# Patient Record
Sex: Male | Born: 1940 | Race: Black or African American | Hispanic: No | Marital: Married | Smoking: Never smoker
Health system: Southern US, Community
[De-identification: ages and names within clinical notes are randomized; demographics above are authoritative.]

## PROBLEM LIST (undated history)

## (undated) ENCOUNTER — Emergency Department (HOSPITAL_COMMUNITY): Admission: EM | Payer: Medicare Other | Source: Home / Self Care

## (undated) DIAGNOSIS — I10 Essential (primary) hypertension: Secondary | ICD-10-CM

## (undated) DIAGNOSIS — N289 Disorder of kidney and ureter, unspecified: Secondary | ICD-10-CM

## (undated) HISTORY — PX: NO PAST SURGERIES: SHX2092

---

## 1999-06-17 ENCOUNTER — Encounter: Admission: RE | Admit: 1999-06-17 | Discharge: 1999-06-17 | Payer: Self-pay | Admitting: Gastroenterology

## 1999-06-17 ENCOUNTER — Encounter: Payer: Self-pay | Admitting: Gastroenterology

## 1999-07-21 ENCOUNTER — Ambulatory Visit (HOSPITAL_COMMUNITY): Admission: RE | Admit: 1999-07-21 | Discharge: 1999-07-21 | Payer: Self-pay | Admitting: Gastroenterology

## 2006-06-22 ENCOUNTER — Encounter: Admission: RE | Admit: 2006-06-22 | Discharge: 2006-06-22 | Payer: Self-pay | Admitting: Surgery

## 2006-09-01 ENCOUNTER — Ambulatory Visit (HOSPITAL_COMMUNITY): Admission: RE | Admit: 2006-09-01 | Discharge: 2006-09-02 | Payer: Self-pay | Admitting: Surgery

## 2011-08-30 DIAGNOSIS — B079 Viral wart, unspecified: Secondary | ICD-10-CM | POA: Diagnosis not present

## 2011-09-21 DIAGNOSIS — N401 Enlarged prostate with lower urinary tract symptoms: Secondary | ICD-10-CM | POA: Diagnosis not present

## 2011-09-21 DIAGNOSIS — R972 Elevated prostate specific antigen [PSA]: Secondary | ICD-10-CM | POA: Diagnosis not present

## 2011-10-04 DIAGNOSIS — B079 Viral wart, unspecified: Secondary | ICD-10-CM | POA: Diagnosis not present

## 2011-10-13 DIAGNOSIS — H2510 Age-related nuclear cataract, unspecified eye: Secondary | ICD-10-CM | POA: Diagnosis not present

## 2011-11-01 DIAGNOSIS — B079 Viral wart, unspecified: Secondary | ICD-10-CM | POA: Diagnosis not present

## 2011-11-28 DIAGNOSIS — Z23 Encounter for immunization: Secondary | ICD-10-CM | POA: Diagnosis not present

## 2011-11-28 DIAGNOSIS — Z0000 Encounter for general adult medical examination without abnormal findings: Secondary | ICD-10-CM | POA: Diagnosis not present

## 2011-11-28 DIAGNOSIS — I10 Essential (primary) hypertension: Secondary | ICD-10-CM | POA: Diagnosis not present

## 2011-11-28 DIAGNOSIS — E7800 Pure hypercholesterolemia, unspecified: Secondary | ICD-10-CM | POA: Diagnosis not present

## 2011-11-28 DIAGNOSIS — Z79899 Other long term (current) drug therapy: Secondary | ICD-10-CM | POA: Diagnosis not present

## 2011-12-06 DIAGNOSIS — B353 Tinea pedis: Secondary | ICD-10-CM | POA: Diagnosis not present

## 2011-12-06 DIAGNOSIS — B079 Viral wart, unspecified: Secondary | ICD-10-CM | POA: Diagnosis not present

## 2011-12-14 DIAGNOSIS — R972 Elevated prostate specific antigen [PSA]: Secondary | ICD-10-CM | POA: Diagnosis not present

## 2011-12-21 DIAGNOSIS — N401 Enlarged prostate with lower urinary tract symptoms: Secondary | ICD-10-CM | POA: Diagnosis not present

## 2011-12-21 DIAGNOSIS — R972 Elevated prostate specific antigen [PSA]: Secondary | ICD-10-CM | POA: Diagnosis not present

## 2012-01-10 DIAGNOSIS — B079 Viral wart, unspecified: Secondary | ICD-10-CM | POA: Diagnosis not present

## 2012-03-06 DIAGNOSIS — B079 Viral wart, unspecified: Secondary | ICD-10-CM | POA: Diagnosis not present

## 2012-03-26 DIAGNOSIS — R972 Elevated prostate specific antigen [PSA]: Secondary | ICD-10-CM | POA: Diagnosis not present

## 2012-04-02 DIAGNOSIS — N401 Enlarged prostate with lower urinary tract symptoms: Secondary | ICD-10-CM | POA: Diagnosis not present

## 2012-04-02 DIAGNOSIS — R972 Elevated prostate specific antigen [PSA]: Secondary | ICD-10-CM | POA: Diagnosis not present

## 2012-04-24 DIAGNOSIS — B079 Viral wart, unspecified: Secondary | ICD-10-CM | POA: Diagnosis not present

## 2012-05-22 DIAGNOSIS — B079 Viral wart, unspecified: Secondary | ICD-10-CM | POA: Diagnosis not present

## 2012-06-26 DIAGNOSIS — B079 Viral wart, unspecified: Secondary | ICD-10-CM | POA: Diagnosis not present

## 2012-06-26 DIAGNOSIS — B353 Tinea pedis: Secondary | ICD-10-CM | POA: Diagnosis not present

## 2012-07-24 DIAGNOSIS — B079 Viral wart, unspecified: Secondary | ICD-10-CM | POA: Diagnosis not present

## 2012-09-26 DIAGNOSIS — R972 Elevated prostate specific antigen [PSA]: Secondary | ICD-10-CM | POA: Diagnosis not present

## 2012-10-18 DIAGNOSIS — H524 Presbyopia: Secondary | ICD-10-CM | POA: Diagnosis not present

## 2012-10-18 DIAGNOSIS — H2510 Age-related nuclear cataract, unspecified eye: Secondary | ICD-10-CM | POA: Diagnosis not present

## 2012-10-18 DIAGNOSIS — H5210 Myopia, unspecified eye: Secondary | ICD-10-CM | POA: Diagnosis not present

## 2012-11-29 DIAGNOSIS — Z79899 Other long term (current) drug therapy: Secondary | ICD-10-CM | POA: Diagnosis not present

## 2012-11-29 DIAGNOSIS — I10 Essential (primary) hypertension: Secondary | ICD-10-CM | POA: Diagnosis not present

## 2012-11-29 DIAGNOSIS — Z0000 Encounter for general adult medical examination without abnormal findings: Secondary | ICD-10-CM | POA: Diagnosis not present

## 2012-11-29 DIAGNOSIS — D509 Iron deficiency anemia, unspecified: Secondary | ICD-10-CM | POA: Diagnosis not present

## 2012-11-29 DIAGNOSIS — E7800 Pure hypercholesterolemia, unspecified: Secondary | ICD-10-CM | POA: Diagnosis not present

## 2012-11-29 DIAGNOSIS — K219 Gastro-esophageal reflux disease without esophagitis: Secondary | ICD-10-CM | POA: Diagnosis not present

## 2013-03-27 DIAGNOSIS — R972 Elevated prostate specific antigen [PSA]: Secondary | ICD-10-CM | POA: Diagnosis not present

## 2013-04-03 ENCOUNTER — Other Ambulatory Visit (HOSPITAL_COMMUNITY): Payer: Self-pay | Admitting: Urology

## 2013-04-03 DIAGNOSIS — N401 Enlarged prostate with lower urinary tract symptoms: Secondary | ICD-10-CM | POA: Diagnosis not present

## 2013-04-03 DIAGNOSIS — R972 Elevated prostate specific antigen [PSA]: Secondary | ICD-10-CM

## 2013-04-11 ENCOUNTER — Ambulatory Visit (HOSPITAL_COMMUNITY)
Admission: RE | Admit: 2013-04-11 | Discharge: 2013-04-11 | Disposition: A | Discharge status: Home / Self Care | Payer: Medicare Other | Source: Ambulatory Visit | Attending: Urology | Admitting: Urology

## 2013-04-11 DIAGNOSIS — N423 Unspecified dysplasia of prostate: Secondary | ICD-10-CM | POA: Insufficient documentation

## 2013-04-11 DIAGNOSIS — N400 Enlarged prostate without lower urinary tract symptoms: Secondary | ICD-10-CM | POA: Diagnosis not present

## 2013-04-11 DIAGNOSIS — R972 Elevated prostate specific antigen [PSA]: Secondary | ICD-10-CM | POA: Diagnosis not present

## 2013-04-11 LAB — CREATININE, SERUM
Creatinine, Ser: 1.5 mg/dL — ABNORMAL HIGH (ref 0.50–1.35)
GFR calc Af Amer: 53 mL/min — ABNORMAL LOW
GFR calc non Af Amer: 46 mL/min — ABNORMAL LOW

## 2013-04-11 MED ORDER — GADOBENATE DIMEGLUMINE 529 MG/ML IV SOLN
20.0000 mL | Freq: Once | INTRAVENOUS | Status: AC | PRN
  Administered 2013-04-11: 16 mL via INTRAVENOUS

## 2013-09-30 DIAGNOSIS — R972 Elevated prostate specific antigen [PSA]: Secondary | ICD-10-CM | POA: Diagnosis not present

## 2013-10-07 DIAGNOSIS — N139 Obstructive and reflux uropathy, unspecified: Secondary | ICD-10-CM | POA: Diagnosis not present

## 2013-10-07 DIAGNOSIS — R972 Elevated prostate specific antigen [PSA]: Secondary | ICD-10-CM | POA: Diagnosis not present

## 2013-10-07 DIAGNOSIS — N401 Enlarged prostate with lower urinary tract symptoms: Secondary | ICD-10-CM | POA: Diagnosis not present

## 2013-10-24 DIAGNOSIS — H524 Presbyopia: Secondary | ICD-10-CM | POA: Diagnosis not present

## 2013-10-24 DIAGNOSIS — H5210 Myopia, unspecified eye: Secondary | ICD-10-CM | POA: Diagnosis not present

## 2013-10-24 DIAGNOSIS — H2510 Age-related nuclear cataract, unspecified eye: Secondary | ICD-10-CM | POA: Diagnosis not present

## 2013-12-03 DIAGNOSIS — Z0000 Encounter for general adult medical examination without abnormal findings: Secondary | ICD-10-CM | POA: Diagnosis not present

## 2013-12-03 DIAGNOSIS — K219 Gastro-esophageal reflux disease without esophagitis: Secondary | ICD-10-CM | POA: Diagnosis not present

## 2013-12-03 DIAGNOSIS — I10 Essential (primary) hypertension: Secondary | ICD-10-CM | POA: Diagnosis not present

## 2013-12-03 DIAGNOSIS — Z23 Encounter for immunization: Secondary | ICD-10-CM | POA: Diagnosis not present

## 2013-12-03 DIAGNOSIS — E7800 Pure hypercholesterolemia, unspecified: Secondary | ICD-10-CM | POA: Diagnosis not present

## 2013-12-09 DIAGNOSIS — IMO0002 Reserved for concepts with insufficient information to code with codable children: Secondary | ICD-10-CM | POA: Diagnosis not present

## 2013-12-09 DIAGNOSIS — R972 Elevated prostate specific antigen [PSA]: Secondary | ICD-10-CM | POA: Diagnosis not present

## 2013-12-16 DIAGNOSIS — R5381 Other malaise: Secondary | ICD-10-CM | POA: Diagnosis not present

## 2013-12-16 DIAGNOSIS — E785 Hyperlipidemia, unspecified: Secondary | ICD-10-CM | POA: Diagnosis not present

## 2013-12-16 DIAGNOSIS — D6489 Other specified anemias: Secondary | ICD-10-CM | POA: Diagnosis not present

## 2013-12-16 DIAGNOSIS — R5383 Other fatigue: Secondary | ICD-10-CM | POA: Diagnosis not present

## 2013-12-16 DIAGNOSIS — I10 Essential (primary) hypertension: Secondary | ICD-10-CM | POA: Diagnosis not present

## 2013-12-16 DIAGNOSIS — K219 Gastro-esophageal reflux disease without esophagitis: Secondary | ICD-10-CM | POA: Diagnosis not present

## 2013-12-25 DIAGNOSIS — R0609 Other forms of dyspnea: Secondary | ICD-10-CM | POA: Diagnosis not present

## 2013-12-25 DIAGNOSIS — R0989 Other specified symptoms and signs involving the circulatory and respiratory systems: Secondary | ICD-10-CM | POA: Diagnosis not present

## 2014-02-28 DIAGNOSIS — I10 Essential (primary) hypertension: Secondary | ICD-10-CM | POA: Diagnosis not present

## 2014-04-09 DIAGNOSIS — I129 Hypertensive chronic kidney disease with stage 1 through stage 4 chronic kidney disease, or unspecified chronic kidney disease: Secondary | ICD-10-CM | POA: Diagnosis not present

## 2014-04-09 DIAGNOSIS — D539 Nutritional anemia, unspecified: Secondary | ICD-10-CM | POA: Diagnosis not present

## 2014-04-09 DIAGNOSIS — N183 Chronic kidney disease, stage 3 unspecified: Secondary | ICD-10-CM | POA: Diagnosis not present

## 2014-04-14 DIAGNOSIS — N183 Chronic kidney disease, stage 3 unspecified: Secondary | ICD-10-CM | POA: Diagnosis not present

## 2014-04-17 DIAGNOSIS — Q619 Cystic kidney disease, unspecified: Secondary | ICD-10-CM | POA: Diagnosis not present

## 2014-05-19 DIAGNOSIS — D539 Nutritional anemia, unspecified: Secondary | ICD-10-CM | POA: Diagnosis not present

## 2014-05-19 DIAGNOSIS — N183 Chronic kidney disease, stage 3 unspecified: Secondary | ICD-10-CM | POA: Diagnosis not present

## 2014-05-21 DIAGNOSIS — Q619 Cystic kidney disease, unspecified: Secondary | ICD-10-CM | POA: Diagnosis not present

## 2014-05-21 DIAGNOSIS — N183 Chronic kidney disease, stage 3 unspecified: Secondary | ICD-10-CM | POA: Diagnosis not present

## 2014-05-21 DIAGNOSIS — I129 Hypertensive chronic kidney disease with stage 1 through stage 4 chronic kidney disease, or unspecified chronic kidney disease: Secondary | ICD-10-CM | POA: Diagnosis not present

## 2014-06-18 DIAGNOSIS — R972 Elevated prostate specific antigen [PSA]: Secondary | ICD-10-CM | POA: Diagnosis not present

## 2014-06-18 DIAGNOSIS — N401 Enlarged prostate with lower urinary tract symptoms: Secondary | ICD-10-CM | POA: Diagnosis not present

## 2014-06-18 DIAGNOSIS — R3914 Feeling of incomplete bladder emptying: Secondary | ICD-10-CM | POA: Diagnosis not present

## 2014-06-18 DIAGNOSIS — R350 Frequency of micturition: Secondary | ICD-10-CM | POA: Diagnosis not present

## 2014-09-22 DIAGNOSIS — N183 Chronic kidney disease, stage 3 (moderate): Secondary | ICD-10-CM | POA: Diagnosis not present

## 2014-09-24 DIAGNOSIS — N183 Chronic kidney disease, stage 3 (moderate): Secondary | ICD-10-CM | POA: Diagnosis not present

## 2014-09-24 DIAGNOSIS — Q6102 Congenital multiple renal cysts: Secondary | ICD-10-CM | POA: Diagnosis not present

## 2014-09-24 DIAGNOSIS — I129 Hypertensive chronic kidney disease with stage 1 through stage 4 chronic kidney disease, or unspecified chronic kidney disease: Secondary | ICD-10-CM | POA: Diagnosis not present

## 2014-10-30 DIAGNOSIS — H524 Presbyopia: Secondary | ICD-10-CM | POA: Diagnosis not present

## 2014-10-30 DIAGNOSIS — H2513 Age-related nuclear cataract, bilateral: Secondary | ICD-10-CM | POA: Diagnosis not present

## 2014-12-22 DIAGNOSIS — Z0000 Encounter for general adult medical examination without abnormal findings: Secondary | ICD-10-CM | POA: Diagnosis not present

## 2014-12-22 DIAGNOSIS — I10 Essential (primary) hypertension: Secondary | ICD-10-CM | POA: Diagnosis not present

## 2014-12-22 DIAGNOSIS — Z79899 Other long term (current) drug therapy: Secondary | ICD-10-CM | POA: Diagnosis not present

## 2014-12-22 DIAGNOSIS — K219 Gastro-esophageal reflux disease without esophagitis: Secondary | ICD-10-CM | POA: Diagnosis not present

## 2014-12-22 DIAGNOSIS — N183 Chronic kidney disease, stage 3 (moderate): Secondary | ICD-10-CM | POA: Diagnosis not present

## 2014-12-22 DIAGNOSIS — D649 Anemia, unspecified: Secondary | ICD-10-CM | POA: Diagnosis not present

## 2014-12-22 DIAGNOSIS — E780 Pure hypercholesterolemia: Secondary | ICD-10-CM | POA: Diagnosis not present

## 2014-12-23 DIAGNOSIS — R350 Frequency of micturition: Secondary | ICD-10-CM | POA: Diagnosis not present

## 2014-12-23 DIAGNOSIS — R351 Nocturia: Secondary | ICD-10-CM | POA: Diagnosis not present

## 2015-01-12 DIAGNOSIS — N183 Chronic kidney disease, stage 3 (moderate): Secondary | ICD-10-CM | POA: Diagnosis not present

## 2015-01-16 DIAGNOSIS — I129 Hypertensive chronic kidney disease with stage 1 through stage 4 chronic kidney disease, or unspecified chronic kidney disease: Secondary | ICD-10-CM | POA: Diagnosis not present

## 2015-01-16 DIAGNOSIS — N183 Chronic kidney disease, stage 3 (moderate): Secondary | ICD-10-CM | POA: Diagnosis not present

## 2015-01-16 DIAGNOSIS — N281 Cyst of kidney, acquired: Secondary | ICD-10-CM | POA: Diagnosis not present

## 2015-01-20 DIAGNOSIS — R972 Elevated prostate specific antigen [PSA]: Secondary | ICD-10-CM | POA: Diagnosis not present

## 2015-01-26 DIAGNOSIS — N401 Enlarged prostate with lower urinary tract symptoms: Secondary | ICD-10-CM | POA: Diagnosis not present

## 2015-01-26 DIAGNOSIS — R351 Nocturia: Secondary | ICD-10-CM | POA: Diagnosis not present

## 2015-01-26 DIAGNOSIS — N3281 Overactive bladder: Secondary | ICD-10-CM | POA: Diagnosis not present

## 2015-03-05 DIAGNOSIS — R972 Elevated prostate specific antigen [PSA]: Secondary | ICD-10-CM | POA: Diagnosis not present

## 2015-03-05 DIAGNOSIS — R351 Nocturia: Secondary | ICD-10-CM | POA: Diagnosis not present

## 2015-03-05 DIAGNOSIS — R3914 Feeling of incomplete bladder emptying: Secondary | ICD-10-CM | POA: Diagnosis not present

## 2015-03-05 DIAGNOSIS — N401 Enlarged prostate with lower urinary tract symptoms: Secondary | ICD-10-CM | POA: Diagnosis not present

## 2015-05-12 DIAGNOSIS — N183 Chronic kidney disease, stage 3 (moderate): Secondary | ICD-10-CM | POA: Diagnosis not present

## 2015-05-19 DIAGNOSIS — E559 Vitamin D deficiency, unspecified: Secondary | ICD-10-CM | POA: Diagnosis not present

## 2015-05-19 DIAGNOSIS — N183 Chronic kidney disease, stage 3 (moderate): Secondary | ICD-10-CM | POA: Diagnosis not present

## 2015-05-19 DIAGNOSIS — I129 Hypertensive chronic kidney disease with stage 1 through stage 4 chronic kidney disease, or unspecified chronic kidney disease: Secondary | ICD-10-CM | POA: Diagnosis not present

## 2015-05-19 DIAGNOSIS — N281 Cyst of kidney, acquired: Secondary | ICD-10-CM | POA: Diagnosis not present

## 2015-09-02 DIAGNOSIS — R972 Elevated prostate specific antigen [PSA]: Secondary | ICD-10-CM | POA: Diagnosis not present

## 2015-09-09 DIAGNOSIS — R972 Elevated prostate specific antigen [PSA]: Secondary | ICD-10-CM | POA: Diagnosis not present

## 2015-09-09 DIAGNOSIS — N401 Enlarged prostate with lower urinary tract symptoms: Secondary | ICD-10-CM | POA: Diagnosis not present

## 2015-09-09 DIAGNOSIS — Z0000 Encounter for general adult medical examination without abnormal findings: Secondary | ICD-10-CM | POA: Diagnosis not present

## 2015-09-09 DIAGNOSIS — R351 Nocturia: Secondary | ICD-10-CM | POA: Diagnosis not present

## 2015-09-10 ENCOUNTER — Other Ambulatory Visit: Payer: Self-pay | Admitting: Urology

## 2015-09-10 DIAGNOSIS — R972 Elevated prostate specific antigen [PSA]: Secondary | ICD-10-CM

## 2015-09-15 DIAGNOSIS — N183 Chronic kidney disease, stage 3 (moderate): Secondary | ICD-10-CM | POA: Diagnosis not present

## 2015-09-15 DIAGNOSIS — Z1159 Encounter for screening for other viral diseases: Secondary | ICD-10-CM | POA: Diagnosis not present

## 2015-09-17 ENCOUNTER — Ambulatory Visit (HOSPITAL_COMMUNITY)
Admission: RE | Admit: 2015-09-17 | Discharge: 2015-09-17 | Disposition: A | Discharge status: Home / Self Care | Payer: Medicare Other | Source: Ambulatory Visit | Attending: Urology | Admitting: Urology

## 2015-09-17 DIAGNOSIS — N400 Enlarged prostate without lower urinary tract symptoms: Secondary | ICD-10-CM | POA: Insufficient documentation

## 2015-09-17 DIAGNOSIS — R972 Elevated prostate specific antigen [PSA]: Secondary | ICD-10-CM | POA: Insufficient documentation

## 2015-09-17 LAB — POCT I-STAT CREATININE: CREATININE: 1.9 mg/dL — AB (ref 0.61–1.24)

## 2015-09-17 MED ORDER — GADOBENATE DIMEGLUMINE 529 MG/ML IV SOLN
10.0000 mL | Freq: Once | INTRAVENOUS | Status: AC | PRN
  Administered 2015-09-17: 8 mL via INTRAVENOUS

## 2015-09-21 DIAGNOSIS — N183 Chronic kidney disease, stage 3 (moderate): Secondary | ICD-10-CM | POA: Diagnosis not present

## 2015-09-21 DIAGNOSIS — I129 Hypertensive chronic kidney disease with stage 1 through stage 4 chronic kidney disease, or unspecified chronic kidney disease: Secondary | ICD-10-CM | POA: Diagnosis not present

## 2015-12-16 DIAGNOSIS — R972 Elevated prostate specific antigen [PSA]: Secondary | ICD-10-CM | POA: Diagnosis not present

## 2015-12-16 DIAGNOSIS — Z0000 Encounter for general adult medical examination without abnormal findings: Secondary | ICD-10-CM | POA: Diagnosis not present

## 2015-12-16 DIAGNOSIS — N401 Enlarged prostate with lower urinary tract symptoms: Secondary | ICD-10-CM | POA: Diagnosis not present

## 2015-12-16 DIAGNOSIS — R350 Frequency of micturition: Secondary | ICD-10-CM | POA: Diagnosis not present

## 2015-12-17 DIAGNOSIS — H5213 Myopia, bilateral: Secondary | ICD-10-CM | POA: Diagnosis not present

## 2015-12-17 DIAGNOSIS — H2513 Age-related nuclear cataract, bilateral: Secondary | ICD-10-CM | POA: Diagnosis not present

## 2015-12-17 DIAGNOSIS — H524 Presbyopia: Secondary | ICD-10-CM | POA: Diagnosis not present

## 2016-01-07 DIAGNOSIS — E7800 Pure hypercholesterolemia, unspecified: Secondary | ICD-10-CM | POA: Diagnosis not present

## 2016-01-07 DIAGNOSIS — I10 Essential (primary) hypertension: Secondary | ICD-10-CM | POA: Diagnosis not present

## 2016-01-07 DIAGNOSIS — Z0000 Encounter for general adult medical examination without abnormal findings: Secondary | ICD-10-CM | POA: Diagnosis not present

## 2016-01-07 DIAGNOSIS — Z79899 Other long term (current) drug therapy: Secondary | ICD-10-CM | POA: Diagnosis not present

## 2016-01-11 DIAGNOSIS — I129 Hypertensive chronic kidney disease with stage 1 through stage 4 chronic kidney disease, or unspecified chronic kidney disease: Secondary | ICD-10-CM | POA: Diagnosis not present

## 2016-01-11 DIAGNOSIS — N183 Chronic kidney disease, stage 3 (moderate): Secondary | ICD-10-CM | POA: Diagnosis not present

## 2016-01-14 DIAGNOSIS — N183 Chronic kidney disease, stage 3 unspecified: Secondary | ICD-10-CM | POA: Insufficient documentation

## 2016-01-14 DIAGNOSIS — E559 Vitamin D deficiency, unspecified: Secondary | ICD-10-CM | POA: Insufficient documentation

## 2016-01-14 DIAGNOSIS — I129 Hypertensive chronic kidney disease with stage 1 through stage 4 chronic kidney disease, or unspecified chronic kidney disease: Secondary | ICD-10-CM | POA: Diagnosis not present

## 2016-02-01 DIAGNOSIS — E7800 Pure hypercholesterolemia, unspecified: Secondary | ICD-10-CM | POA: Diagnosis not present

## 2016-03-09 DIAGNOSIS — R972 Elevated prostate specific antigen [PSA]: Secondary | ICD-10-CM | POA: Diagnosis not present

## 2016-03-16 DIAGNOSIS — R972 Elevated prostate specific antigen [PSA]: Secondary | ICD-10-CM | POA: Diagnosis not present

## 2016-03-16 DIAGNOSIS — N401 Enlarged prostate with lower urinary tract symptoms: Secondary | ICD-10-CM | POA: Diagnosis not present

## 2016-03-16 DIAGNOSIS — R350 Frequency of micturition: Secondary | ICD-10-CM | POA: Diagnosis not present

## 2016-05-10 DIAGNOSIS — N183 Chronic kidney disease, stage 3 (moderate): Secondary | ICD-10-CM | POA: Diagnosis not present

## 2016-05-10 DIAGNOSIS — E559 Vitamin D deficiency, unspecified: Secondary | ICD-10-CM | POA: Diagnosis not present

## 2016-05-17 DIAGNOSIS — E876 Hypokalemia: Secondary | ICD-10-CM | POA: Diagnosis not present

## 2016-05-17 DIAGNOSIS — I129 Hypertensive chronic kidney disease with stage 1 through stage 4 chronic kidney disease, or unspecified chronic kidney disease: Secondary | ICD-10-CM | POA: Diagnosis not present

## 2016-05-17 DIAGNOSIS — N183 Chronic kidney disease, stage 3 (moderate): Secondary | ICD-10-CM | POA: Diagnosis not present

## 2016-07-04 DIAGNOSIS — B079 Viral wart, unspecified: Secondary | ICD-10-CM | POA: Diagnosis not present

## 2016-09-14 DIAGNOSIS — N183 Chronic kidney disease, stage 3 (moderate): Secondary | ICD-10-CM | POA: Diagnosis not present

## 2016-09-19 DIAGNOSIS — N183 Chronic kidney disease, stage 3 (moderate): Secondary | ICD-10-CM | POA: Diagnosis not present

## 2016-09-19 DIAGNOSIS — I129 Hypertensive chronic kidney disease with stage 1 through stage 4 chronic kidney disease, or unspecified chronic kidney disease: Secondary | ICD-10-CM | POA: Diagnosis not present

## 2016-12-22 DIAGNOSIS — H5213 Myopia, bilateral: Secondary | ICD-10-CM | POA: Diagnosis not present

## 2016-12-22 DIAGNOSIS — H2513 Age-related nuclear cataract, bilateral: Secondary | ICD-10-CM | POA: Diagnosis not present

## 2017-01-17 DIAGNOSIS — N183 Chronic kidney disease, stage 3 (moderate): Secondary | ICD-10-CM | POA: Diagnosis not present

## 2017-01-18 DIAGNOSIS — R600 Localized edema: Secondary | ICD-10-CM | POA: Diagnosis not present

## 2017-01-18 DIAGNOSIS — N183 Chronic kidney disease, stage 3 (moderate): Secondary | ICD-10-CM | POA: Diagnosis not present

## 2017-01-18 DIAGNOSIS — I129 Hypertensive chronic kidney disease with stage 1 through stage 4 chronic kidney disease, or unspecified chronic kidney disease: Secondary | ICD-10-CM | POA: Diagnosis not present

## 2017-01-23 DIAGNOSIS — K219 Gastro-esophageal reflux disease without esophagitis: Secondary | ICD-10-CM | POA: Diagnosis not present

## 2017-01-23 DIAGNOSIS — Z23 Encounter for immunization: Secondary | ICD-10-CM | POA: Diagnosis not present

## 2017-01-23 DIAGNOSIS — Z0000 Encounter for general adult medical examination without abnormal findings: Secondary | ICD-10-CM | POA: Diagnosis not present

## 2017-01-23 DIAGNOSIS — E7800 Pure hypercholesterolemia, unspecified: Secondary | ICD-10-CM | POA: Diagnosis not present

## 2017-01-23 DIAGNOSIS — Z79899 Other long term (current) drug therapy: Secondary | ICD-10-CM | POA: Diagnosis not present

## 2017-01-23 DIAGNOSIS — I10 Essential (primary) hypertension: Secondary | ICD-10-CM | POA: Diagnosis not present

## 2017-01-24 DIAGNOSIS — R972 Elevated prostate specific antigen [PSA]: Secondary | ICD-10-CM | POA: Diagnosis not present

## 2017-01-31 DIAGNOSIS — N401 Enlarged prostate with lower urinary tract symptoms: Secondary | ICD-10-CM | POA: Diagnosis not present

## 2017-01-31 DIAGNOSIS — R972 Elevated prostate specific antigen [PSA]: Secondary | ICD-10-CM | POA: Diagnosis not present

## 2017-01-31 DIAGNOSIS — R351 Nocturia: Secondary | ICD-10-CM | POA: Diagnosis not present

## 2017-03-10 DIAGNOSIS — L218 Other seborrheic dermatitis: Secondary | ICD-10-CM | POA: Diagnosis not present

## 2017-04-04 DIAGNOSIS — N183 Chronic kidney disease, stage 3 (moderate): Secondary | ICD-10-CM | POA: Diagnosis not present

## 2017-04-07 DIAGNOSIS — E876 Hypokalemia: Secondary | ICD-10-CM | POA: Diagnosis not present

## 2017-04-07 DIAGNOSIS — I129 Hypertensive chronic kidney disease with stage 1 through stage 4 chronic kidney disease, or unspecified chronic kidney disease: Secondary | ICD-10-CM | POA: Diagnosis not present

## 2017-04-07 DIAGNOSIS — N183 Chronic kidney disease, stage 3 (moderate): Secondary | ICD-10-CM | POA: Diagnosis not present

## 2017-06-28 DIAGNOSIS — N183 Chronic kidney disease, stage 3 (moderate): Secondary | ICD-10-CM | POA: Diagnosis not present

## 2017-07-05 DIAGNOSIS — I129 Hypertensive chronic kidney disease with stage 1 through stage 4 chronic kidney disease, or unspecified chronic kidney disease: Secondary | ICD-10-CM | POA: Diagnosis not present

## 2017-07-05 DIAGNOSIS — N183 Chronic kidney disease, stage 3 (moderate): Secondary | ICD-10-CM | POA: Diagnosis not present

## 2017-07-21 DIAGNOSIS — R351 Nocturia: Secondary | ICD-10-CM | POA: Diagnosis not present

## 2017-07-21 DIAGNOSIS — N401 Enlarged prostate with lower urinary tract symptoms: Secondary | ICD-10-CM | POA: Diagnosis not present

## 2017-07-21 DIAGNOSIS — R972 Elevated prostate specific antigen [PSA]: Secondary | ICD-10-CM | POA: Diagnosis not present

## 2017-10-09 DIAGNOSIS — N183 Chronic kidney disease, stage 3 (moderate): Secondary | ICD-10-CM | POA: Diagnosis not present

## 2017-10-16 DIAGNOSIS — I129 Hypertensive chronic kidney disease with stage 1 through stage 4 chronic kidney disease, or unspecified chronic kidney disease: Secondary | ICD-10-CM | POA: Diagnosis not present

## 2017-10-16 DIAGNOSIS — N183 Chronic kidney disease, stage 3 (moderate): Secondary | ICD-10-CM | POA: Diagnosis not present

## 2017-10-16 DIAGNOSIS — E559 Vitamin D deficiency, unspecified: Secondary | ICD-10-CM | POA: Diagnosis not present

## 2017-12-28 DIAGNOSIS — H2513 Age-related nuclear cataract, bilateral: Secondary | ICD-10-CM | POA: Diagnosis not present

## 2018-01-17 DIAGNOSIS — N183 Chronic kidney disease, stage 3 (moderate): Secondary | ICD-10-CM | POA: Diagnosis not present

## 2018-01-17 DIAGNOSIS — R350 Frequency of micturition: Secondary | ICD-10-CM | POA: Diagnosis not present

## 2018-01-17 DIAGNOSIS — N401 Enlarged prostate with lower urinary tract symptoms: Secondary | ICD-10-CM | POA: Diagnosis not present

## 2018-01-17 DIAGNOSIS — R972 Elevated prostate specific antigen [PSA]: Secondary | ICD-10-CM | POA: Diagnosis not present

## 2018-01-22 DIAGNOSIS — N183 Chronic kidney disease, stage 3 (moderate): Secondary | ICD-10-CM | POA: Diagnosis not present

## 2018-01-22 DIAGNOSIS — I129 Hypertensive chronic kidney disease with stage 1 through stage 4 chronic kidney disease, or unspecified chronic kidney disease: Secondary | ICD-10-CM | POA: Diagnosis not present

## 2018-01-22 DIAGNOSIS — E876 Hypokalemia: Secondary | ICD-10-CM | POA: Diagnosis not present

## 2018-02-06 DIAGNOSIS — R351 Nocturia: Secondary | ICD-10-CM | POA: Diagnosis not present

## 2018-02-06 DIAGNOSIS — R3914 Feeling of incomplete bladder emptying: Secondary | ICD-10-CM | POA: Diagnosis not present

## 2018-02-06 DIAGNOSIS — N3281 Overactive bladder: Secondary | ICD-10-CM | POA: Diagnosis not present

## 2018-03-30 DIAGNOSIS — R972 Elevated prostate specific antigen [PSA]: Secondary | ICD-10-CM | POA: Diagnosis not present

## 2018-03-30 DIAGNOSIS — N401 Enlarged prostate with lower urinary tract symptoms: Secondary | ICD-10-CM | POA: Diagnosis not present

## 2018-03-30 DIAGNOSIS — R350 Frequency of micturition: Secondary | ICD-10-CM | POA: Diagnosis not present

## 2018-04-05 DIAGNOSIS — E7800 Pure hypercholesterolemia, unspecified: Secondary | ICD-10-CM | POA: Diagnosis not present

## 2018-04-05 DIAGNOSIS — Z79899 Other long term (current) drug therapy: Secondary | ICD-10-CM | POA: Diagnosis not present

## 2018-04-10 DIAGNOSIS — I10 Essential (primary) hypertension: Secondary | ICD-10-CM | POA: Diagnosis not present

## 2018-04-10 DIAGNOSIS — K219 Gastro-esophageal reflux disease without esophagitis: Secondary | ICD-10-CM | POA: Diagnosis not present

## 2018-04-10 DIAGNOSIS — Z0000 Encounter for general adult medical examination without abnormal findings: Secondary | ICD-10-CM | POA: Diagnosis not present

## 2018-04-10 DIAGNOSIS — E7800 Pure hypercholesterolemia, unspecified: Secondary | ICD-10-CM | POA: Diagnosis not present

## 2018-04-26 DIAGNOSIS — N183 Chronic kidney disease, stage 3 (moderate): Secondary | ICD-10-CM | POA: Diagnosis not present

## 2018-04-30 DIAGNOSIS — N183 Chronic kidney disease, stage 3 (moderate): Secondary | ICD-10-CM | POA: Diagnosis not present

## 2018-04-30 DIAGNOSIS — I129 Hypertensive chronic kidney disease with stage 1 through stage 4 chronic kidney disease, or unspecified chronic kidney disease: Secondary | ICD-10-CM | POA: Diagnosis not present

## 2018-05-01 DIAGNOSIS — R972 Elevated prostate specific antigen [PSA]: Secondary | ICD-10-CM | POA: Diagnosis not present

## 2018-05-09 DIAGNOSIS — N3281 Overactive bladder: Secondary | ICD-10-CM | POA: Diagnosis not present

## 2018-05-09 DIAGNOSIS — N401 Enlarged prostate with lower urinary tract symptoms: Secondary | ICD-10-CM | POA: Diagnosis not present

## 2018-05-09 DIAGNOSIS — R972 Elevated prostate specific antigen [PSA]: Secondary | ICD-10-CM | POA: Diagnosis not present

## 2018-07-31 DIAGNOSIS — R3129 Other microscopic hematuria: Secondary | ICD-10-CM | POA: Diagnosis not present

## 2018-07-31 DIAGNOSIS — N183 Chronic kidney disease, stage 3 (moderate): Secondary | ICD-10-CM | POA: Diagnosis not present

## 2018-08-07 DIAGNOSIS — N183 Chronic kidney disease, stage 3 (moderate): Secondary | ICD-10-CM | POA: Diagnosis not present

## 2018-08-07 DIAGNOSIS — I129 Hypertensive chronic kidney disease with stage 1 through stage 4 chronic kidney disease, or unspecified chronic kidney disease: Secondary | ICD-10-CM | POA: Diagnosis not present

## 2018-10-23 DIAGNOSIS — R972 Elevated prostate specific antigen [PSA]: Secondary | ICD-10-CM | POA: Diagnosis not present

## 2018-11-07 DIAGNOSIS — N183 Chronic kidney disease, stage 3 (moderate): Secondary | ICD-10-CM | POA: Diagnosis not present

## 2018-11-13 DIAGNOSIS — I129 Hypertensive chronic kidney disease with stage 1 through stage 4 chronic kidney disease, or unspecified chronic kidney disease: Secondary | ICD-10-CM | POA: Diagnosis not present

## 2018-11-13 DIAGNOSIS — E876 Hypokalemia: Secondary | ICD-10-CM | POA: Diagnosis not present

## 2018-11-13 DIAGNOSIS — E559 Vitamin D deficiency, unspecified: Secondary | ICD-10-CM | POA: Diagnosis not present

## 2018-11-13 DIAGNOSIS — N183 Chronic kidney disease, stage 3 (moderate): Secondary | ICD-10-CM | POA: Diagnosis not present

## 2018-11-13 DIAGNOSIS — R001 Bradycardia, unspecified: Secondary | ICD-10-CM | POA: Insufficient documentation

## 2019-01-03 DIAGNOSIS — H2513 Age-related nuclear cataract, bilateral: Secondary | ICD-10-CM | POA: Diagnosis not present

## 2019-01-25 DIAGNOSIS — Z7189 Other specified counseling: Secondary | ICD-10-CM | POA: Diagnosis not present

## 2019-01-25 DIAGNOSIS — Z20828 Contact with and (suspected) exposure to other viral communicable diseases: Secondary | ICD-10-CM | POA: Diagnosis not present

## 2019-03-14 DIAGNOSIS — N183 Chronic kidney disease, stage 3 (moderate): Secondary | ICD-10-CM | POA: Diagnosis not present

## 2019-03-20 DIAGNOSIS — E559 Vitamin D deficiency, unspecified: Secondary | ICD-10-CM | POA: Diagnosis not present

## 2019-03-20 DIAGNOSIS — I129 Hypertensive chronic kidney disease with stage 1 through stage 4 chronic kidney disease, or unspecified chronic kidney disease: Secondary | ICD-10-CM | POA: Diagnosis not present

## 2019-03-20 DIAGNOSIS — R001 Bradycardia, unspecified: Secondary | ICD-10-CM | POA: Diagnosis not present

## 2019-03-20 DIAGNOSIS — D631 Anemia in chronic kidney disease: Secondary | ICD-10-CM | POA: Diagnosis not present

## 2019-03-20 DIAGNOSIS — N183 Chronic kidney disease, stage 3 (moderate): Secondary | ICD-10-CM | POA: Diagnosis not present

## 2019-04-17 DIAGNOSIS — E7800 Pure hypercholesterolemia, unspecified: Secondary | ICD-10-CM | POA: Diagnosis not present

## 2019-04-17 DIAGNOSIS — R001 Bradycardia, unspecified: Secondary | ICD-10-CM | POA: Diagnosis not present

## 2019-04-17 DIAGNOSIS — K219 Gastro-esophageal reflux disease without esophagitis: Secondary | ICD-10-CM | POA: Diagnosis not present

## 2019-04-17 DIAGNOSIS — I10 Essential (primary) hypertension: Secondary | ICD-10-CM | POA: Diagnosis not present

## 2019-04-17 DIAGNOSIS — Z0000 Encounter for general adult medical examination without abnormal findings: Secondary | ICD-10-CM | POA: Diagnosis not present

## 2019-06-19 DIAGNOSIS — D631 Anemia in chronic kidney disease: Secondary | ICD-10-CM | POA: Diagnosis not present

## 2019-06-19 DIAGNOSIS — N183 Chronic kidney disease, stage 3 unspecified: Secondary | ICD-10-CM | POA: Diagnosis not present

## 2019-06-25 DIAGNOSIS — I129 Hypertensive chronic kidney disease with stage 1 through stage 4 chronic kidney disease, or unspecified chronic kidney disease: Secondary | ICD-10-CM | POA: Diagnosis not present

## 2019-06-25 DIAGNOSIS — N183 Chronic kidney disease, stage 3 unspecified: Secondary | ICD-10-CM | POA: Diagnosis not present

## 2019-06-25 DIAGNOSIS — N1832 Chronic kidney disease, stage 3b: Secondary | ICD-10-CM | POA: Diagnosis not present

## 2019-07-03 DIAGNOSIS — N183 Chronic kidney disease, stage 3 unspecified: Secondary | ICD-10-CM | POA: Diagnosis not present

## 2019-07-03 DIAGNOSIS — D649 Anemia, unspecified: Secondary | ICD-10-CM | POA: Diagnosis not present

## 2019-07-03 DIAGNOSIS — N401 Enlarged prostate with lower urinary tract symptoms: Secondary | ICD-10-CM | POA: Diagnosis not present

## 2019-07-03 DIAGNOSIS — E7800 Pure hypercholesterolemia, unspecified: Secondary | ICD-10-CM | POA: Diagnosis not present

## 2019-07-03 DIAGNOSIS — I10 Essential (primary) hypertension: Secondary | ICD-10-CM | POA: Diagnosis not present

## 2019-07-04 DIAGNOSIS — N401 Enlarged prostate with lower urinary tract symptoms: Secondary | ICD-10-CM | POA: Diagnosis not present

## 2019-07-04 DIAGNOSIS — R972 Elevated prostate specific antigen [PSA]: Secondary | ICD-10-CM | POA: Diagnosis not present

## 2019-07-04 DIAGNOSIS — R351 Nocturia: Secondary | ICD-10-CM | POA: Diagnosis not present

## 2019-08-01 DIAGNOSIS — L300 Nummular dermatitis: Secondary | ICD-10-CM | POA: Diagnosis not present

## 2019-08-01 DIAGNOSIS — L218 Other seborrheic dermatitis: Secondary | ICD-10-CM | POA: Diagnosis not present

## 2019-09-03 DIAGNOSIS — I10 Essential (primary) hypertension: Secondary | ICD-10-CM | POA: Diagnosis not present

## 2019-09-03 DIAGNOSIS — N401 Enlarged prostate with lower urinary tract symptoms: Secondary | ICD-10-CM | POA: Diagnosis not present

## 2019-09-03 DIAGNOSIS — N183 Chronic kidney disease, stage 3 unspecified: Secondary | ICD-10-CM | POA: Diagnosis not present

## 2019-09-03 DIAGNOSIS — D649 Anemia, unspecified: Secondary | ICD-10-CM | POA: Diagnosis not present

## 2019-09-03 DIAGNOSIS — E7800 Pure hypercholesterolemia, unspecified: Secondary | ICD-10-CM | POA: Diagnosis not present

## 2019-09-12 ENCOUNTER — Ambulatory Visit: Payer: Medicare Other | Attending: Internal Medicine

## 2019-09-12 DIAGNOSIS — Z23 Encounter for immunization: Secondary | ICD-10-CM

## 2019-09-12 NOTE — Progress Notes (Signed)
   Covid-19 Vaccination Clinic  Name:  Marcus Bruce    MRN: [id number] DOB: 24-Oct-1940  09/12/2019  [name] was observed post Covid-19 immunization for 15 minutes without incidence. He was provided with Vaccine Information Sheet and instruction to access the V-Safe system.   [name] was instructed to call 911 with any severe reactions post vaccine: Marland Kitchen Difficulty breathing  . Swelling of your face and throat  . A fast heartbeat  . A bad rash all over your body  . Dizziness and weakness    Immunizations Administered    Name Date Dose VIS Date Route   Pfizer COVID-19 Vaccine 09/12/2019  2:04 PM 0.3 mL 08/02/2019 Intramuscular   Manufacturer: [place]   Lot: [id number]   [place]: [id number]

## 2019-09-24 DIAGNOSIS — N1832 Chronic kidney disease, stage 3b: Secondary | ICD-10-CM | POA: Diagnosis not present

## 2019-10-01 DIAGNOSIS — I129 Hypertensive chronic kidney disease with stage 1 through stage 4 chronic kidney disease, or unspecified chronic kidney disease: Secondary | ICD-10-CM | POA: Diagnosis not present

## 2019-10-01 DIAGNOSIS — N183 Chronic kidney disease, stage 3 unspecified: Secondary | ICD-10-CM | POA: Diagnosis not present

## 2019-10-01 DIAGNOSIS — N1832 Chronic kidney disease, stage 3b: Secondary | ICD-10-CM | POA: Diagnosis not present

## 2019-10-02 ENCOUNTER — Ambulatory Visit: Payer: Medicare Other | Attending: Internal Medicine

## 2019-10-02 DIAGNOSIS — E7800 Pure hypercholesterolemia, unspecified: Secondary | ICD-10-CM | POA: Diagnosis not present

## 2019-10-02 DIAGNOSIS — N183 Chronic kidney disease, stage 3 unspecified: Secondary | ICD-10-CM | POA: Diagnosis not present

## 2019-10-02 DIAGNOSIS — D649 Anemia, unspecified: Secondary | ICD-10-CM | POA: Diagnosis not present

## 2019-10-02 DIAGNOSIS — Z23 Encounter for immunization: Secondary | ICD-10-CM | POA: Insufficient documentation

## 2019-10-02 DIAGNOSIS — N401 Enlarged prostate with lower urinary tract symptoms: Secondary | ICD-10-CM | POA: Diagnosis not present

## 2019-10-02 DIAGNOSIS — I10 Essential (primary) hypertension: Secondary | ICD-10-CM | POA: Diagnosis not present

## 2019-10-02 NOTE — Progress Notes (Signed)
   Covid-19 Vaccination Clinic  Name:  Marcus Bruce    MRN: [id number] DOB: May 16, 1941  10/02/2019  Marcus Bruce was observed post Covid-19 immunization for 15 minutes without incidence. He was provided with Vaccine Information Sheet and instruction to access the V-Safe system.   Marcus Bruce was instructed to call 911 with any severe reactions post vaccine: Marland Kitchen Difficulty breathing  . Swelling of your face and throat  . A fast heartbeat  . A bad rash all over your body  . Dizziness and weakness    Immunizations Administered    Name Date Dose VIS Date Route   Pfizer COVID-19 Vaccine 10/02/2019  8:18 AM 0.3 mL 08/02/2019 Intramuscular   Manufacturer: [place]   Lot: [id number]   [place]: [id number]

## 2019-12-13 DIAGNOSIS — Z1213 Encounter for screening for malignant neoplasm of small intestine: Secondary | ICD-10-CM | POA: Diagnosis not present

## 2019-12-13 DIAGNOSIS — K219 Gastro-esophageal reflux disease without esophagitis: Secondary | ICD-10-CM | POA: Diagnosis not present

## 2019-12-26 DIAGNOSIS — R972 Elevated prostate specific antigen [PSA]: Secondary | ICD-10-CM | POA: Diagnosis not present

## 2019-12-31 DIAGNOSIS — N183 Chronic kidney disease, stage 3 unspecified: Secondary | ICD-10-CM | POA: Diagnosis not present

## 2019-12-31 DIAGNOSIS — E7800 Pure hypercholesterolemia, unspecified: Secondary | ICD-10-CM | POA: Diagnosis not present

## 2019-12-31 DIAGNOSIS — N1832 Chronic kidney disease, stage 3b: Secondary | ICD-10-CM | POA: Diagnosis not present

## 2019-12-31 DIAGNOSIS — N401 Enlarged prostate with lower urinary tract symptoms: Secondary | ICD-10-CM | POA: Diagnosis not present

## 2019-12-31 DIAGNOSIS — D649 Anemia, unspecified: Secondary | ICD-10-CM | POA: Diagnosis not present

## 2019-12-31 DIAGNOSIS — I10 Essential (primary) hypertension: Secondary | ICD-10-CM | POA: Diagnosis not present

## 2020-01-02 DIAGNOSIS — N1832 Chronic kidney disease, stage 3b: Secondary | ICD-10-CM | POA: Diagnosis not present

## 2020-01-02 DIAGNOSIS — N401 Enlarged prostate with lower urinary tract symptoms: Secondary | ICD-10-CM | POA: Diagnosis not present

## 2020-01-02 DIAGNOSIS — R972 Elevated prostate specific antigen [PSA]: Secondary | ICD-10-CM | POA: Diagnosis not present

## 2020-01-02 DIAGNOSIS — R350 Frequency of micturition: Secondary | ICD-10-CM | POA: Diagnosis not present

## 2020-01-06 DIAGNOSIS — N183 Chronic kidney disease, stage 3 unspecified: Secondary | ICD-10-CM | POA: Diagnosis not present

## 2020-01-06 DIAGNOSIS — N1831 Chronic kidney disease, stage 3a: Secondary | ICD-10-CM | POA: Diagnosis not present

## 2020-01-06 DIAGNOSIS — I129 Hypertensive chronic kidney disease with stage 1 through stage 4 chronic kidney disease, or unspecified chronic kidney disease: Secondary | ICD-10-CM | POA: Diagnosis not present

## 2020-01-08 ENCOUNTER — Other Ambulatory Visit: Payer: Self-pay | Admitting: Urology

## 2020-01-08 DIAGNOSIS — R972 Elevated prostate specific antigen [PSA]: Secondary | ICD-10-CM

## 2020-01-27 DIAGNOSIS — Z1159 Encounter for screening for other viral diseases: Secondary | ICD-10-CM | POA: Diagnosis not present

## 2020-01-30 DIAGNOSIS — K317 Polyp of stomach and duodenum: Secondary | ICD-10-CM | POA: Diagnosis not present

## 2020-01-30 DIAGNOSIS — K269 Duodenal ulcer, unspecified as acute or chronic, without hemorrhage or perforation: Secondary | ICD-10-CM | POA: Diagnosis not present

## 2020-01-30 DIAGNOSIS — K635 Polyp of colon: Secondary | ICD-10-CM | POA: Diagnosis not present

## 2020-01-30 DIAGNOSIS — D122 Benign neoplasm of ascending colon: Secondary | ICD-10-CM | POA: Diagnosis not present

## 2020-01-30 DIAGNOSIS — K644 Residual hemorrhoidal skin tags: Secondary | ICD-10-CM | POA: Diagnosis not present

## 2020-01-30 DIAGNOSIS — Z1211 Encounter for screening for malignant neoplasm of colon: Secondary | ICD-10-CM | POA: Diagnosis not present

## 2020-01-30 DIAGNOSIS — D124 Benign neoplasm of descending colon: Secondary | ICD-10-CM | POA: Diagnosis not present

## 2020-01-30 DIAGNOSIS — K2930 Chronic superficial gastritis without bleeding: Secondary | ICD-10-CM | POA: Diagnosis not present

## 2020-01-30 DIAGNOSIS — K222 Esophageal obstruction: Secondary | ICD-10-CM | POA: Diagnosis not present

## 2020-01-30 DIAGNOSIS — K648 Other hemorrhoids: Secondary | ICD-10-CM | POA: Diagnosis not present

## 2020-01-30 DIAGNOSIS — K2970 Gastritis, unspecified, without bleeding: Secondary | ICD-10-CM | POA: Diagnosis not present

## 2020-02-03 DIAGNOSIS — R351 Nocturia: Secondary | ICD-10-CM | POA: Diagnosis not present

## 2020-02-03 DIAGNOSIS — R3914 Feeling of incomplete bladder emptying: Secondary | ICD-10-CM | POA: Diagnosis not present

## 2020-02-03 DIAGNOSIS — R350 Frequency of micturition: Secondary | ICD-10-CM | POA: Diagnosis not present

## 2020-02-03 DIAGNOSIS — N3281 Overactive bladder: Secondary | ICD-10-CM | POA: Diagnosis not present

## 2020-02-03 DIAGNOSIS — N401 Enlarged prostate with lower urinary tract symptoms: Secondary | ICD-10-CM | POA: Diagnosis not present

## 2020-02-05 DIAGNOSIS — K635 Polyp of colon: Secondary | ICD-10-CM | POA: Diagnosis not present

## 2020-02-05 DIAGNOSIS — D124 Benign neoplasm of descending colon: Secondary | ICD-10-CM | POA: Diagnosis not present

## 2020-02-05 DIAGNOSIS — K317 Polyp of stomach and duodenum: Secondary | ICD-10-CM | POA: Diagnosis not present

## 2020-02-05 DIAGNOSIS — K2930 Chronic superficial gastritis without bleeding: Secondary | ICD-10-CM | POA: Diagnosis not present

## 2020-02-05 DIAGNOSIS — D122 Benign neoplasm of ascending colon: Secondary | ICD-10-CM | POA: Diagnosis not present

## 2020-02-10 ENCOUNTER — Ambulatory Visit
Admission: RE | Admit: 2020-02-10 | Discharge: 2020-02-10 | Disposition: A | Discharge status: Home / Self Care | Payer: Medicare Other | Source: Ambulatory Visit | Attending: Urology | Admitting: Urology

## 2020-02-10 ENCOUNTER — Other Ambulatory Visit: Payer: Self-pay

## 2020-02-10 DIAGNOSIS — R972 Elevated prostate specific antigen [PSA]: Secondary | ICD-10-CM

## 2020-02-10 MED ORDER — GADOBENATE DIMEGLUMINE 529 MG/ML IV SOLN
15.0000 mL | Freq: Once | INTRAVENOUS | Status: AC | PRN
  Administered 2020-02-10: 15 mL via INTRAVENOUS

## 2020-02-11 DIAGNOSIS — M5136 Other intervertebral disc degeneration, lumbar region: Secondary | ICD-10-CM | POA: Diagnosis not present

## 2020-02-11 DIAGNOSIS — M5137 Other intervertebral disc degeneration, lumbosacral region: Secondary | ICD-10-CM | POA: Diagnosis not present

## 2020-02-11 DIAGNOSIS — M545 Low back pain: Secondary | ICD-10-CM | POA: Diagnosis not present

## 2020-02-11 DIAGNOSIS — M9903 Segmental and somatic dysfunction of lumbar region: Secondary | ICD-10-CM | POA: Diagnosis not present

## 2020-02-12 DIAGNOSIS — M9903 Segmental and somatic dysfunction of lumbar region: Secondary | ICD-10-CM | POA: Diagnosis not present

## 2020-02-12 DIAGNOSIS — M545 Low back pain: Secondary | ICD-10-CM | POA: Diagnosis not present

## 2020-02-12 DIAGNOSIS — M5136 Other intervertebral disc degeneration, lumbar region: Secondary | ICD-10-CM | POA: Diagnosis not present

## 2020-02-12 DIAGNOSIS — M5137 Other intervertebral disc degeneration, lumbosacral region: Secondary | ICD-10-CM | POA: Diagnosis not present

## 2020-02-13 DIAGNOSIS — M545 Low back pain: Secondary | ICD-10-CM | POA: Diagnosis not present

## 2020-02-13 DIAGNOSIS — M9903 Segmental and somatic dysfunction of lumbar region: Secondary | ICD-10-CM | POA: Diagnosis not present

## 2020-02-13 DIAGNOSIS — M5137 Other intervertebral disc degeneration, lumbosacral region: Secondary | ICD-10-CM | POA: Diagnosis not present

## 2020-02-13 DIAGNOSIS — M5136 Other intervertebral disc degeneration, lumbar region: Secondary | ICD-10-CM | POA: Diagnosis not present

## 2020-02-17 DIAGNOSIS — M5136 Other intervertebral disc degeneration, lumbar region: Secondary | ICD-10-CM | POA: Diagnosis not present

## 2020-02-17 DIAGNOSIS — M5137 Other intervertebral disc degeneration, lumbosacral region: Secondary | ICD-10-CM | POA: Diagnosis not present

## 2020-02-17 DIAGNOSIS — M9903 Segmental and somatic dysfunction of lumbar region: Secondary | ICD-10-CM | POA: Diagnosis not present

## 2020-02-17 DIAGNOSIS — M545 Low back pain: Secondary | ICD-10-CM | POA: Diagnosis not present

## 2020-02-19 DIAGNOSIS — M5137 Other intervertebral disc degeneration, lumbosacral region: Secondary | ICD-10-CM | POA: Diagnosis not present

## 2020-02-19 DIAGNOSIS — M9903 Segmental and somatic dysfunction of lumbar region: Secondary | ICD-10-CM | POA: Diagnosis not present

## 2020-02-19 DIAGNOSIS — M5136 Other intervertebral disc degeneration, lumbar region: Secondary | ICD-10-CM | POA: Diagnosis not present

## 2020-02-19 DIAGNOSIS — M545 Low back pain: Secondary | ICD-10-CM | POA: Diagnosis not present

## 2020-02-25 DIAGNOSIS — M545 Low back pain: Secondary | ICD-10-CM | POA: Diagnosis not present

## 2020-02-25 DIAGNOSIS — M5136 Other intervertebral disc degeneration, lumbar region: Secondary | ICD-10-CM | POA: Diagnosis not present

## 2020-02-25 DIAGNOSIS — M5137 Other intervertebral disc degeneration, lumbosacral region: Secondary | ICD-10-CM | POA: Diagnosis not present

## 2020-02-25 DIAGNOSIS — M9903 Segmental and somatic dysfunction of lumbar region: Secondary | ICD-10-CM | POA: Diagnosis not present

## 2020-02-27 DIAGNOSIS — M5136 Other intervertebral disc degeneration, lumbar region: Secondary | ICD-10-CM | POA: Diagnosis not present

## 2020-02-27 DIAGNOSIS — M5137 Other intervertebral disc degeneration, lumbosacral region: Secondary | ICD-10-CM | POA: Diagnosis not present

## 2020-02-27 DIAGNOSIS — M545 Low back pain: Secondary | ICD-10-CM | POA: Diagnosis not present

## 2020-02-27 DIAGNOSIS — M9903 Segmental and somatic dysfunction of lumbar region: Secondary | ICD-10-CM | POA: Diagnosis not present

## 2020-03-02 DIAGNOSIS — M545 Low back pain: Secondary | ICD-10-CM | POA: Diagnosis not present

## 2020-03-02 DIAGNOSIS — M5137 Other intervertebral disc degeneration, lumbosacral region: Secondary | ICD-10-CM | POA: Diagnosis not present

## 2020-03-02 DIAGNOSIS — M9903 Segmental and somatic dysfunction of lumbar region: Secondary | ICD-10-CM | POA: Diagnosis not present

## 2020-03-02 DIAGNOSIS — M5136 Other intervertebral disc degeneration, lumbar region: Secondary | ICD-10-CM | POA: Diagnosis not present

## 2020-03-04 DIAGNOSIS — M5136 Other intervertebral disc degeneration, lumbar region: Secondary | ICD-10-CM | POA: Diagnosis not present

## 2020-03-04 DIAGNOSIS — M5137 Other intervertebral disc degeneration, lumbosacral region: Secondary | ICD-10-CM | POA: Diagnosis not present

## 2020-03-04 DIAGNOSIS — M545 Low back pain: Secondary | ICD-10-CM | POA: Diagnosis not present

## 2020-03-04 DIAGNOSIS — M9903 Segmental and somatic dysfunction of lumbar region: Secondary | ICD-10-CM | POA: Diagnosis not present

## 2020-03-09 DIAGNOSIS — M9903 Segmental and somatic dysfunction of lumbar region: Secondary | ICD-10-CM | POA: Diagnosis not present

## 2020-03-09 DIAGNOSIS — M5136 Other intervertebral disc degeneration, lumbar region: Secondary | ICD-10-CM | POA: Diagnosis not present

## 2020-03-09 DIAGNOSIS — M5137 Other intervertebral disc degeneration, lumbosacral region: Secondary | ICD-10-CM | POA: Diagnosis not present

## 2020-03-09 DIAGNOSIS — M545 Low back pain: Secondary | ICD-10-CM | POA: Diagnosis not present

## 2020-03-10 DIAGNOSIS — M545 Low back pain: Secondary | ICD-10-CM | POA: Diagnosis not present

## 2020-03-10 DIAGNOSIS — M5137 Other intervertebral disc degeneration, lumbosacral region: Secondary | ICD-10-CM | POA: Diagnosis not present

## 2020-03-10 DIAGNOSIS — M5136 Other intervertebral disc degeneration, lumbar region: Secondary | ICD-10-CM | POA: Diagnosis not present

## 2020-03-10 DIAGNOSIS — M9903 Segmental and somatic dysfunction of lumbar region: Secondary | ICD-10-CM | POA: Diagnosis not present

## 2020-03-16 DIAGNOSIS — M5137 Other intervertebral disc degeneration, lumbosacral region: Secondary | ICD-10-CM | POA: Diagnosis not present

## 2020-03-16 DIAGNOSIS — M545 Low back pain: Secondary | ICD-10-CM | POA: Diagnosis not present

## 2020-03-16 DIAGNOSIS — M9903 Segmental and somatic dysfunction of lumbar region: Secondary | ICD-10-CM | POA: Diagnosis not present

## 2020-03-16 DIAGNOSIS — M5136 Other intervertebral disc degeneration, lumbar region: Secondary | ICD-10-CM | POA: Diagnosis not present

## 2020-03-23 DIAGNOSIS — M9903 Segmental and somatic dysfunction of lumbar region: Secondary | ICD-10-CM | POA: Diagnosis not present

## 2020-03-23 DIAGNOSIS — M5137 Other intervertebral disc degeneration, lumbosacral region: Secondary | ICD-10-CM | POA: Diagnosis not present

## 2020-03-23 DIAGNOSIS — M5136 Other intervertebral disc degeneration, lumbar region: Secondary | ICD-10-CM | POA: Diagnosis not present

## 2020-03-23 DIAGNOSIS — M545 Low back pain: Secondary | ICD-10-CM | POA: Diagnosis not present

## 2020-03-30 DIAGNOSIS — M5136 Other intervertebral disc degeneration, lumbar region: Secondary | ICD-10-CM | POA: Diagnosis not present

## 2020-03-30 DIAGNOSIS — M5137 Other intervertebral disc degeneration, lumbosacral region: Secondary | ICD-10-CM | POA: Diagnosis not present

## 2020-03-30 DIAGNOSIS — M9903 Segmental and somatic dysfunction of lumbar region: Secondary | ICD-10-CM | POA: Diagnosis not present

## 2020-03-30 DIAGNOSIS — M545 Low back pain: Secondary | ICD-10-CM | POA: Diagnosis not present

## 2020-04-07 DIAGNOSIS — N1831 Chronic kidney disease, stage 3a: Secondary | ICD-10-CM | POA: Diagnosis not present

## 2020-04-13 DIAGNOSIS — N183 Chronic kidney disease, stage 3 unspecified: Secondary | ICD-10-CM | POA: Diagnosis not present

## 2020-04-13 DIAGNOSIS — N1832 Chronic kidney disease, stage 3b: Secondary | ICD-10-CM | POA: Diagnosis not present

## 2020-04-13 DIAGNOSIS — D631 Anemia in chronic kidney disease: Secondary | ICD-10-CM | POA: Diagnosis not present

## 2020-04-13 DIAGNOSIS — I129 Hypertensive chronic kidney disease with stage 1 through stage 4 chronic kidney disease, or unspecified chronic kidney disease: Secondary | ICD-10-CM | POA: Diagnosis not present

## 2020-04-13 DIAGNOSIS — R001 Bradycardia, unspecified: Secondary | ICD-10-CM | POA: Diagnosis not present

## 2020-04-14 DIAGNOSIS — M9903 Segmental and somatic dysfunction of lumbar region: Secondary | ICD-10-CM | POA: Diagnosis not present

## 2020-04-14 DIAGNOSIS — M545 Low back pain: Secondary | ICD-10-CM | POA: Diagnosis not present

## 2020-04-14 DIAGNOSIS — M5137 Other intervertebral disc degeneration, lumbosacral region: Secondary | ICD-10-CM | POA: Diagnosis not present

## 2020-04-14 DIAGNOSIS — M5136 Other intervertebral disc degeneration, lumbar region: Secondary | ICD-10-CM | POA: Diagnosis not present

## 2020-04-21 DIAGNOSIS — I10 Essential (primary) hypertension: Secondary | ICD-10-CM | POA: Diagnosis not present

## 2020-04-21 DIAGNOSIS — K219 Gastro-esophageal reflux disease without esophagitis: Secondary | ICD-10-CM | POA: Diagnosis not present

## 2020-04-21 DIAGNOSIS — Z0000 Encounter for general adult medical examination without abnormal findings: Secondary | ICD-10-CM | POA: Diagnosis not present

## 2020-04-21 DIAGNOSIS — D649 Anemia, unspecified: Secondary | ICD-10-CM | POA: Diagnosis not present

## 2020-04-21 DIAGNOSIS — E7800 Pure hypercholesterolemia, unspecified: Secondary | ICD-10-CM | POA: Diagnosis not present

## 2020-04-21 DIAGNOSIS — Z79899 Other long term (current) drug therapy: Secondary | ICD-10-CM | POA: Diagnosis not present

## 2020-04-29 DIAGNOSIS — M545 Low back pain: Secondary | ICD-10-CM | POA: Diagnosis not present

## 2020-04-29 DIAGNOSIS — M9903 Segmental and somatic dysfunction of lumbar region: Secondary | ICD-10-CM | POA: Diagnosis not present

## 2020-04-29 DIAGNOSIS — M5136 Other intervertebral disc degeneration, lumbar region: Secondary | ICD-10-CM | POA: Diagnosis not present

## 2020-04-29 DIAGNOSIS — M5137 Other intervertebral disc degeneration, lumbosacral region: Secondary | ICD-10-CM | POA: Diagnosis not present

## 2020-05-19 DIAGNOSIS — R972 Elevated prostate specific antigen [PSA]: Secondary | ICD-10-CM | POA: Diagnosis not present

## 2020-05-25 DIAGNOSIS — N401 Enlarged prostate with lower urinary tract symptoms: Secondary | ICD-10-CM | POA: Diagnosis not present

## 2020-05-25 DIAGNOSIS — R351 Nocturia: Secondary | ICD-10-CM | POA: Diagnosis not present

## 2020-05-25 DIAGNOSIS — R972 Elevated prostate specific antigen [PSA]: Secondary | ICD-10-CM | POA: Diagnosis not present

## 2020-05-26 ENCOUNTER — Ambulatory Visit: Payer: Medicare Other | Attending: Internal Medicine

## 2020-05-26 DIAGNOSIS — Z23 Encounter for immunization: Secondary | ICD-10-CM

## 2020-05-26 NOTE — Progress Notes (Signed)
   Covid-19 Vaccination Clinic  Name:  TAEVIN MCFERRAN    MRN: [id number] DOB: 1941-04-22  05/26/2020  Mr. Pettigrew was observed post Covid-19 immunization for 15 minutes without incident. He was provided with Vaccine Information Sheet and instruction to access the V-Safe system.   Mr. Baswell was instructed to call 911 with any severe reactions post vaccine: Marland Kitchen Difficulty breathing  . Swelling of face and throat  . A fast heartbeat  . A bad rash all over body  . Dizziness and weakness

## 2020-07-02 DIAGNOSIS — K219 Gastro-esophageal reflux disease without esophagitis: Secondary | ICD-10-CM | POA: Diagnosis not present

## 2020-07-02 DIAGNOSIS — N1832 Chronic kidney disease, stage 3b: Secondary | ICD-10-CM | POA: Diagnosis not present

## 2020-07-02 DIAGNOSIS — E7800 Pure hypercholesterolemia, unspecified: Secondary | ICD-10-CM | POA: Diagnosis not present

## 2020-07-02 DIAGNOSIS — D649 Anemia, unspecified: Secondary | ICD-10-CM | POA: Diagnosis not present

## 2020-07-02 DIAGNOSIS — N401 Enlarged prostate with lower urinary tract symptoms: Secondary | ICD-10-CM | POA: Diagnosis not present

## 2020-07-02 DIAGNOSIS — I10 Essential (primary) hypertension: Secondary | ICD-10-CM | POA: Diagnosis not present

## 2020-07-20 DIAGNOSIS — N1832 Chronic kidney disease, stage 3b: Secondary | ICD-10-CM | POA: Diagnosis not present

## 2020-07-22 DIAGNOSIS — R001 Bradycardia, unspecified: Secondary | ICD-10-CM | POA: Insufficient documentation

## 2020-07-22 DIAGNOSIS — N1832 Chronic kidney disease, stage 3b: Secondary | ICD-10-CM | POA: Diagnosis not present

## 2020-07-22 DIAGNOSIS — N183 Chronic kidney disease, stage 3 unspecified: Secondary | ICD-10-CM | POA: Diagnosis not present

## 2020-07-22 DIAGNOSIS — D631 Anemia in chronic kidney disease: Secondary | ICD-10-CM | POA: Diagnosis not present

## 2020-07-22 DIAGNOSIS — I129 Hypertensive chronic kidney disease with stage 1 through stage 4 chronic kidney disease, or unspecified chronic kidney disease: Secondary | ICD-10-CM | POA: Diagnosis not present

## 2020-10-02 IMAGING — MR MR PROSTATE WO/W CM
56 series · 56 of 56 positions shown · IV contrast (multihance)
Comparison: 09/17/2015

CLINICAL DATA: Elevated PSA level of 23.3 on 12/26/2019. History of
prior biopsy on 12/09/2013.

EXAM:
MR PROSTATE WITHOUT AND WITH CONTRAST
TECHNIQUE: Multiplanar multisequence MRI images were obtained of the pelvis
centered about the prostate. Pre and post contrast images were
obtained.
CONTRAST:  15mL MULTIHANCE GADOBENATE DIMEGLUMINE 529 MG/ML IV SOLN

[Series 3: bSSFP fat-sat · axial · 8.0mm · 0.74mm/px · 1 of 28 slices shown]
[im 1/28]
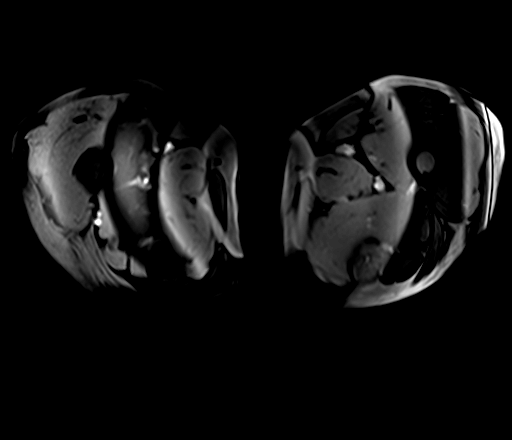

[Series 4: T1 · axial · 5.0mm · 1.25mm/px · 1 of 80 slices shown]
[im 1/80]
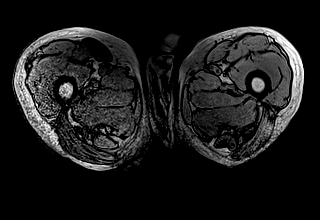

[Series 5: T2 · coronal · 3.5mm · 0.56mm/px · 1 of 26 slices shown (1 of 3)]
[im 1/26]
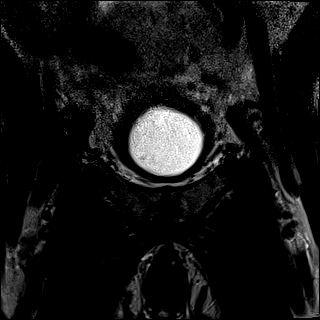

[Series 6: DWI · axial · 3.5mm · 1.75mm/px · 1 of 75 slices shown (1 of 3)]
[im 1/75]
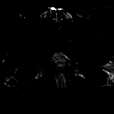

[Series 7: DWI · axial · 3.5mm · 1.75mm/px · 1 of 25 slices shown (2 of 3)]
[im 1/25]
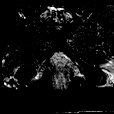

[Series 8: DWI · axial · 3.5mm · 1.56mm/px · 1 of 24 slices shown (3 of 3)]
[im 1/24]
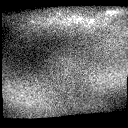

[Series 9: T2 · axial · 3.5mm · 0.56mm/px · 1 of 27 slices shown (2 of 3)]
[im 1/27]
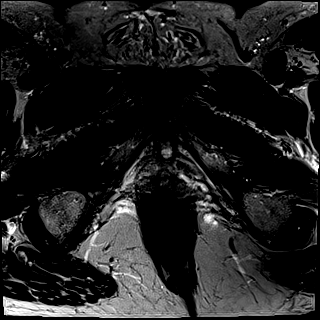

[Series 10: T2 · axial · 1.0mm · 1.04mm/px · 1 of 88 slices shown (3 of 3)]
[im 1/88]
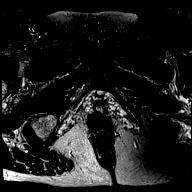

[Series 11: pre t1_twist_tra_dyn_ttc=6.4s · axial · non-contrast · 3.5mm · 0.83mm/px · 1 of 24 slices shown]
[im 1/24]
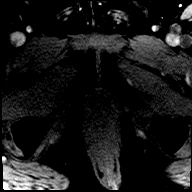

[Series 12: post t1_twist_tra_dyn-copy center · axial · 3.5mm · 0.83mm/px · 1 of 24 slices shown (1 of 24)]
[im 1/24]
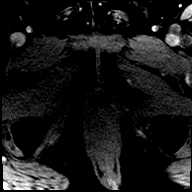

[Series 13: post t1_twist_tra_dyn-copy center · axial · 3.5mm · 0.83mm/px · 1 of 24 slices shown (2 of 24)]
[im 1/24]
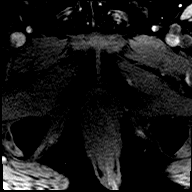

[Series 14: post t1_twist_tra_dyn-copy cent_sub_ttc=(id) · axial · 3.5mm · 0.83mm/px · 1 of 24 slices shown (1 of 23)]
[im 1/24]
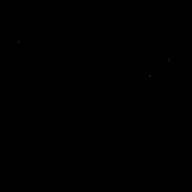

[Series 15: post t1_twist_tra_dyn-copy center · axial · 3.5mm · 0.83mm/px · 1 of 24 slices shown (3 of 24)]
[im 1/24]
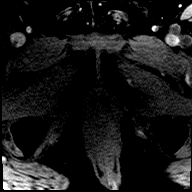

[Series 16: post t1_twist_tra_dyn-copy cent_sub_ttc=(id) · axial · 3.5mm · 0.83mm/px · 1 of 24 slices shown (2 of 23)]
[im 1/24]
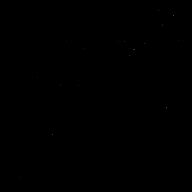

[Series 17: post t1_twist_tra_dyn-copy center · axial · 3.5mm · 0.83mm/px · 1 of 24 slices shown (4 of 24)]
[im 1/24]
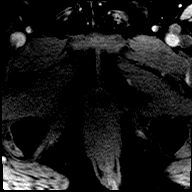

[Series 18: post t1_twist_tra_dyn-copy cent_sub_ttc=(id) · axial · 3.5mm · 0.83mm/px · 1 of 24 slices shown (3 of 23)]
[im 1/24]
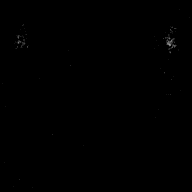

[Series 19: post t1_twist_tra_dyn-copy center · axial · 3.5mm · 0.83mm/px · 1 of 24 slices shown (5 of 24)]
[im 1/24]
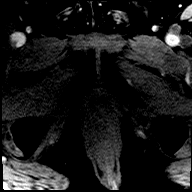

[Series 20: post t1_twist_tra_dyn-copy cent_sub_ttc=(id) · axial · 3.5mm · 0.83mm/px · 1 of 24 slices shown (4 of 23)]
[im 1/24]
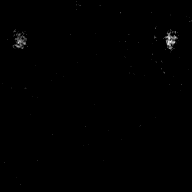

[Series 21: post t1_twist_tra_dyn-copy center · axial · 3.5mm · 0.83mm/px · 1 of 24 slices shown (6 of 24)]
[im 1/24]
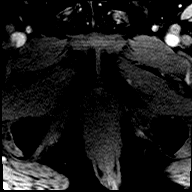

[Series 22: post t1_twist_tra_dyn-copy cent_sub_ttc=(id) · axial · 3.5mm · 0.83mm/px · 1 of 24 slices shown (5 of 23)]
[im 1/24]
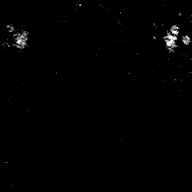

[Series 23: post t1_twist_tra_dyn-copy center · axial · 3.5mm · 0.83mm/px · 1 of 24 slices shown (7 of 24)]
[im 1/24]
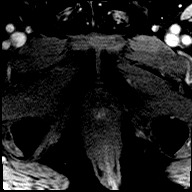

[Series 24: post t1_twist_tra_dyn-copy cent_sub_ttc=(id) · axial · 3.5mm · 0.83mm/px · 1 of 24 slices shown (6 of 23)]
[im 1/24]
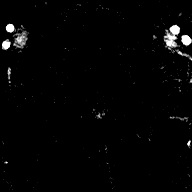

[Series 25: post t1_twist_tra_dyn-copy center · axial · 3.5mm · 0.83mm/px · 1 of 24 slices shown (8 of 24)]
[im 1/24]
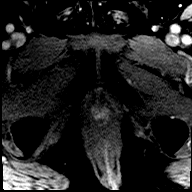

[Series 26: post t1_twist_tra_dyn-copy cent_sub_ttc=(id) · axial · 3.5mm · 0.83mm/px · 1 of 24 slices shown (7 of 23)]
[im 1/24]
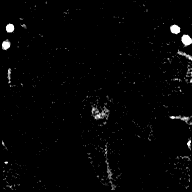

[Series 27: post t1_twist_tra_dyn-copy center · axial · 3.5mm · 0.83mm/px · 1 of 24 slices shown (9 of 24)]
[im 1/24]
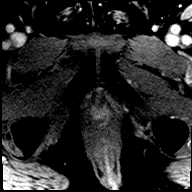

[Series 28: post t1_twist_tra_dyn-copy cent_sub_ttc=(id) · axial · 3.5mm · 0.83mm/px · 1 of 24 slices shown (8 of 23)]
[im 1/24]
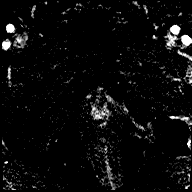

[Series 29: post t1_twist_tra_dyn-copy center · axial · 3.5mm · 0.83mm/px · 1 of 24 slices shown (10 of 24)]
[im 1/24]
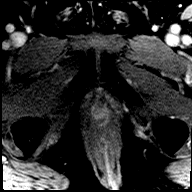

[Series 30: post t1_twist_tra_dyn-copy cent_sub_ttc=(id) · axial · 3.5mm · 0.83mm/px · 1 of 24 slices shown (9 of 23)]
[im 1/24]
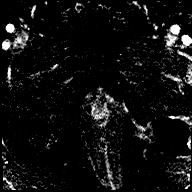

[Series 31: post t1_twist_tra_dyn-copy center · axial · 3.5mm · 0.83mm/px · 1 of 24 slices shown (11 of 24)]
[im 1/24]
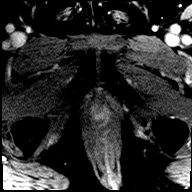

[Series 32: post t1_twist_tra_dyn-copy cent_sub_ttc=(id) · axial · 3.5mm · 0.83mm/px · 1 of 24 slices shown (10 of 23)]
[im 1/24]
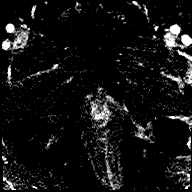

[Series 33: post t1_twist_tra_dyn-copy center · axial · 3.5mm · 0.83mm/px · 1 of 24 slices shown (12 of 24)]
[im 1/24]
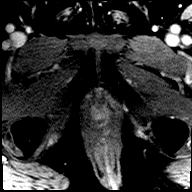

[Series 34: post t1_twist_tra_dyn-copy cent_sub_ttc=(id) · axial · 3.5mm · 0.83mm/px · 1 of 24 slices shown (11 of 23)]
[im 1/24]
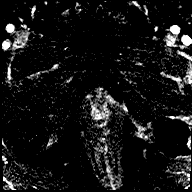

[Series 35: post t1_twist_tra_dyn-copy center · axial · 3.5mm · 0.83mm/px · 1 of 24 slices shown (13 of 24)]
[im 1/24]
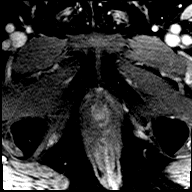

[Series 36: post t1_twist_tra_dyn-copy cent_sub_ttc=(id) · axial · 3.5mm · 0.83mm/px · 1 of 24 slices shown (12 of 23)]
[im 1/24]
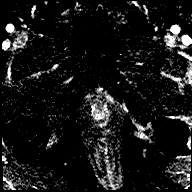

[Series 37: post t1_twist_tra_dyn-copy center · axial · 3.5mm · 0.83mm/px · 1 of 24 slices shown (14 of 24)]
[im 1/24]
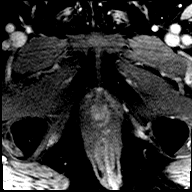

[Series 38: post t1_twist_tra_dyn-copy cent_sub_ttc=(id) · axial · 3.5mm · 0.83mm/px · 1 of 24 slices shown (13 of 23)]
[im 1/24]
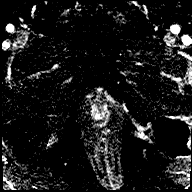

[Series 39: post t1_twist_tra_dyn-copy center · axial · 3.5mm · 0.83mm/px · 1 of 24 slices shown (15 of 24)]
[im 1/24]
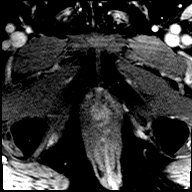

[Series 40: post t1_twist_tra_dyn-copy cent_sub_ttc=(id) · axial · 3.5mm · 0.83mm/px · 1 of 24 slices shown (14 of 23)]
[im 1/24]
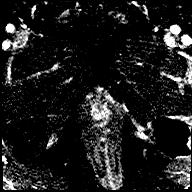

[Series 41: post t1_twist_tra_dyn-copy center · axial · 3.5mm · 0.83mm/px · 1 of 24 slices shown (16 of 24)]
[im 1/24]
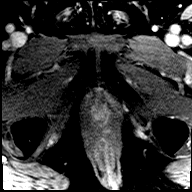

[Series 42: post t1_twist_tra_dyn-copy cent_sub_ttc=(id) · axial · 3.5mm · 0.83mm/px · 1 of 24 slices shown (15 of 23)]
[im 1/24]
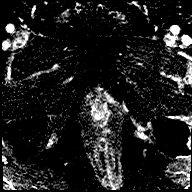

[Series 43: post t1_twist_tra_dyn-copy center · axial · 3.5mm · 0.83mm/px · 1 of 24 slices shown (17 of 24)]
[im 1/24]
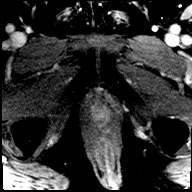

[Series 44: post t1_twist_tra_dyn-copy cent_sub_ttc=(id) · axial · 3.5mm · 0.83mm/px · 1 of 24 slices shown (16 of 23)]
[im 1/24]
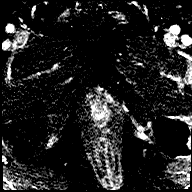

[Series 45: post t1_twist_tra_dyn-copy center · axial · 3.5mm · 0.83mm/px · 1 of 24 slices shown (18 of 24)]
[im 1/24]
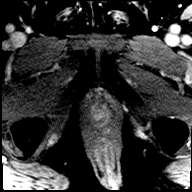

[Series 46: post t1_twist_tra_dyn-copy cent_sub_ttc=(id) · axial · 3.5mm · 0.83mm/px · 1 of 24 slices shown (17 of 23)]
[im 1/24]
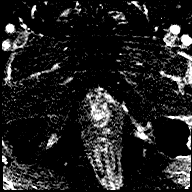

[Series 47: post t1_twist_tra_dyn-copy center · axial · 3.5mm · 0.83mm/px · 1 of 24 slices shown (19 of 24)]
[im 1/24]
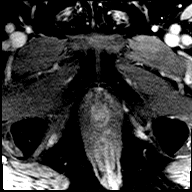

[Series 48: post t1_twist_tra_dyn-copy cent_sub_ttc=(id) · axial · 3.5mm · 0.83mm/px · 1 of 24 slices shown (18 of 23)]
[im 1/24]
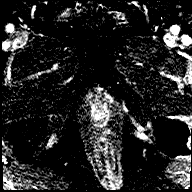

[Series 49: post t1_twist_tra_dyn-copy center · axial · 3.5mm · 0.83mm/px · 1 of 24 slices shown (20 of 24)]
[im 1/24]
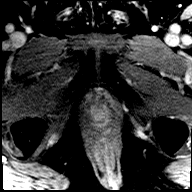

[Series 50: post t1_twist_tra_dyn-copy cent_sub_ttc=(id) · axial · 3.5mm · 0.83mm/px · 1 of 24 slices shown (19 of 23)]
[im 1/24]
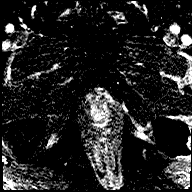

[Series 51: post t1_twist_tra_dyn-copy center · axial · 3.5mm · 0.83mm/px · 1 of 24 slices shown (21 of 24)]
[im 1/24]
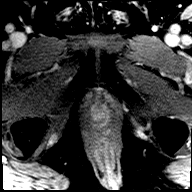

[Series 52: post t1_twist_tra_dyn-copy cent_sub_ttc=(id) · axial · 3.5mm · 0.83mm/px · 1 of 24 slices shown (20 of 23)]
[im 1/24]
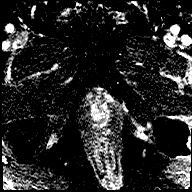

[Series 53: post t1_twist_tra_dyn-copy center · axial · 3.5mm · 0.83mm/px · 1 of 24 slices shown (22 of 24)]
[im 1/24]
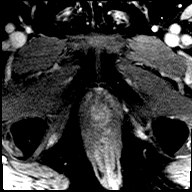

[Series 54: post t1_twist_tra_dyn-copy cent_sub_ttc=(id) · axial · 3.5mm · 0.83mm/px · 1 of 24 slices shown (21 of 23)]
[im 1/24]
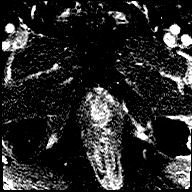

[Series 55: post t1_twist_tra_dyn-copy center · axial · 3.5mm · 0.83mm/px · 1 of 24 slices shown (23 of 24)]
[im 1/24]
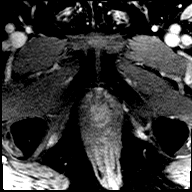

[Series 56: post t1_twist_tra_dyn-copy cent_sub_ttc=(id) · axial · 3.5mm · 0.83mm/px · 1 of 24 slices shown (22 of 23)]
[im 1/24]
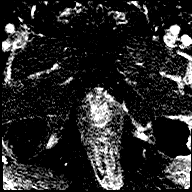

[Series 57: post t1_twist_tra_dyn-copy center · axial · 3.5mm · 0.83mm/px · 1 of 24 slices shown (24 of 24)]
[im 1/24]
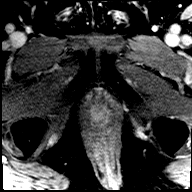

[Series 58: post t1_twist_tra_dyn-copy cent_sub_ttc=(id) · axial · 3.5mm · 0.83mm/px · 1 of 24 slices shown (23 of 23)]
[im 1/24]
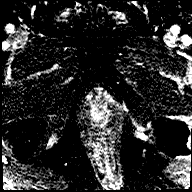

[56 of 56 positions shown; findings below may reference images not displayed]

FINDINGS: Prostate:

Encapsulated nodularity centrally compatible with benign prostatic
hypertrophy.

No significant abnormal reduced T2 or ADC map activity in the
peripheral zone, which remains mildly thinned. There is diffuse
low-grade early enhancement throughout the peripheral zone, which is
likely due to remote inflammation, without obvious focal
involvement.

Homogeneous hypointense regions between nodules for example
inferiorly in the left anterior transition zone, with
diffusion-weighted score of 1, PI-RADS category 2 (likely benign).
No lesions of intermediate or higher suspicion identified for
targeting.

Volume: Prostate volume: 160.41 cubic cm (7.0 by 6.5 by 6.5 cm).

Transcapsular spread:  Absent

Seminal vesicle involvement: Absent

Neurovascular bundle involvement: Absent

Pelvic adenopathy: Absent

Bone metastasis: Absent

Other findings: Accentuated signal surrounding the left sciatic

Below the level of the ischial tuberosities for example on image
[DATE], possibly simply from field heterogeneity, but if the patient
has clinical evidence of left sciatic neuropathy than further
imaging workup might be considered.
IMPRESSION: 1. Benign prostatic hypertrophy with prostate volume 160.4 cubic cm.
No focal lesion of intermediate or higher suspicion is identified by
MRI.
2. Accentuated signal surrounding the left sciatic nerve, possibly
simply from field heterogeneity, but if the patient has clinical
evidence of left sciatic neuropathy then further imaging workup
might be considered.

## 2020-10-05 DIAGNOSIS — N401 Enlarged prostate with lower urinary tract symptoms: Secondary | ICD-10-CM | POA: Diagnosis not present

## 2020-10-05 DIAGNOSIS — I10 Essential (primary) hypertension: Secondary | ICD-10-CM | POA: Diagnosis not present

## 2020-10-05 DIAGNOSIS — N1832 Chronic kidney disease, stage 3b: Secondary | ICD-10-CM | POA: Diagnosis not present

## 2020-10-05 DIAGNOSIS — D649 Anemia, unspecified: Secondary | ICD-10-CM | POA: Diagnosis not present

## 2020-10-05 DIAGNOSIS — E7800 Pure hypercholesterolemia, unspecified: Secondary | ICD-10-CM | POA: Diagnosis not present

## 2020-10-05 DIAGNOSIS — N183 Chronic kidney disease, stage 3 unspecified: Secondary | ICD-10-CM | POA: Diagnosis not present

## 2020-10-05 DIAGNOSIS — K219 Gastro-esophageal reflux disease without esophagitis: Secondary | ICD-10-CM | POA: Diagnosis not present

## 2020-10-26 DIAGNOSIS — N1832 Chronic kidney disease, stage 3b: Secondary | ICD-10-CM | POA: Diagnosis not present

## 2020-10-30 DIAGNOSIS — I129 Hypertensive chronic kidney disease with stage 1 through stage 4 chronic kidney disease, or unspecified chronic kidney disease: Secondary | ICD-10-CM | POA: Diagnosis not present

## 2020-10-30 DIAGNOSIS — N183 Chronic kidney disease, stage 3 unspecified: Secondary | ICD-10-CM | POA: Diagnosis not present

## 2020-10-30 DIAGNOSIS — N1832 Chronic kidney disease, stage 3b: Secondary | ICD-10-CM | POA: Diagnosis not present

## 2020-10-30 DIAGNOSIS — D631 Anemia in chronic kidney disease: Secondary | ICD-10-CM | POA: Diagnosis not present

## 2020-11-30 DIAGNOSIS — R972 Elevated prostate specific antigen [PSA]: Secondary | ICD-10-CM | POA: Diagnosis not present

## 2020-12-03 DIAGNOSIS — R351 Nocturia: Secondary | ICD-10-CM | POA: Diagnosis not present

## 2020-12-03 DIAGNOSIS — R972 Elevated prostate specific antigen [PSA]: Secondary | ICD-10-CM | POA: Diagnosis not present

## 2020-12-03 DIAGNOSIS — N401 Enlarged prostate with lower urinary tract symptoms: Secondary | ICD-10-CM | POA: Diagnosis not present

## 2020-12-08 ENCOUNTER — Other Ambulatory Visit (HOSPITAL_BASED_OUTPATIENT_CLINIC_OR_DEPARTMENT_OTHER): Payer: Self-pay

## 2020-12-09 ENCOUNTER — Other Ambulatory Visit (HOSPITAL_BASED_OUTPATIENT_CLINIC_OR_DEPARTMENT_OTHER): Payer: Self-pay

## 2020-12-09 ENCOUNTER — Ambulatory Visit: Payer: Medicare Other | Attending: Internal Medicine

## 2020-12-09 ENCOUNTER — Other Ambulatory Visit: Payer: Self-pay

## 2020-12-09 DIAGNOSIS — Z23 Encounter for immunization: Secondary | ICD-10-CM

## 2020-12-09 MED ORDER — COVID-19 MRNA VAC-TRIS(PFIZER) 30 MCG/0.3ML IM SUSP
INTRAMUSCULAR | 0 refills | Status: DC
  Filled 2020-12-09: qty 0.3, 1d supply, fill #0

## 2020-12-09 NOTE — Progress Notes (Signed)
   Covid-19 Vaccination Clinic  Name:  Marcus Bruce    MRN: [id number] DOB: [birth year]  12/09/2020  Mr. Marcus Bruce was observed post Covid-19 immunization for 15 minutes without incident. He was provided with Vaccine Information Sheet and instruction to access the V-Safe system.   Mr. Marcus Bruce was instructed to call 911 with any severe reactions post vaccine: Marland Kitchen Difficulty breathing  . Swelling of face and throat  . A fast heartbeat  . A bad rash all over body  . Dizziness and weakness   Immunizations Administered    Name Date Dose VIS Date Route   PFIZER Comrnaty([place]) Covid-19 Vaccine 12/09/2020  1:24 PM 0.3 mL 07/30/2020 Intramuscular   Manufacturer: [place]   Lot: [id number]   [place]: [id number]

## 2020-12-30 DIAGNOSIS — N401 Enlarged prostate with lower urinary tract symptoms: Secondary | ICD-10-CM | POA: Diagnosis not present

## 2020-12-30 DIAGNOSIS — E7800 Pure hypercholesterolemia, unspecified: Secondary | ICD-10-CM | POA: Diagnosis not present

## 2020-12-30 DIAGNOSIS — K219 Gastro-esophageal reflux disease without esophagitis: Secondary | ICD-10-CM | POA: Diagnosis not present

## 2020-12-30 DIAGNOSIS — N1832 Chronic kidney disease, stage 3b: Secondary | ICD-10-CM | POA: Diagnosis not present

## 2020-12-30 DIAGNOSIS — I10 Essential (primary) hypertension: Secondary | ICD-10-CM | POA: Diagnosis not present

## 2020-12-30 DIAGNOSIS — D649 Anemia, unspecified: Secondary | ICD-10-CM | POA: Diagnosis not present

## 2021-01-26 DIAGNOSIS — N1832 Chronic kidney disease, stage 3b: Secondary | ICD-10-CM | POA: Diagnosis not present

## 2021-02-03 DIAGNOSIS — N183 Chronic kidney disease, stage 3 unspecified: Secondary | ICD-10-CM | POA: Diagnosis not present

## 2021-02-03 DIAGNOSIS — D631 Anemia in chronic kidney disease: Secondary | ICD-10-CM | POA: Diagnosis not present

## 2021-02-03 DIAGNOSIS — R001 Bradycardia, unspecified: Secondary | ICD-10-CM | POA: Diagnosis not present

## 2021-02-03 DIAGNOSIS — I129 Hypertensive chronic kidney disease with stage 1 through stage 4 chronic kidney disease, or unspecified chronic kidney disease: Secondary | ICD-10-CM | POA: Diagnosis not present

## 2021-02-03 DIAGNOSIS — N1832 Chronic kidney disease, stage 3b: Secondary | ICD-10-CM | POA: Diagnosis not present

## 2021-02-18 DIAGNOSIS — H25813 Combined forms of age-related cataract, bilateral: Secondary | ICD-10-CM | POA: Diagnosis not present

## 2021-04-15 DIAGNOSIS — Z0000 Encounter for general adult medical examination without abnormal findings: Secondary | ICD-10-CM | POA: Diagnosis not present

## 2021-04-15 DIAGNOSIS — D649 Anemia, unspecified: Secondary | ICD-10-CM | POA: Diagnosis not present

## 2021-04-15 DIAGNOSIS — K219 Gastro-esophageal reflux disease without esophagitis: Secondary | ICD-10-CM | POA: Diagnosis not present

## 2021-04-15 DIAGNOSIS — E7800 Pure hypercholesterolemia, unspecified: Secondary | ICD-10-CM | POA: Diagnosis not present

## 2021-04-15 DIAGNOSIS — Z79899 Other long term (current) drug therapy: Secondary | ICD-10-CM | POA: Diagnosis not present

## 2021-04-15 DIAGNOSIS — I10 Essential (primary) hypertension: Secondary | ICD-10-CM | POA: Diagnosis not present

## 2021-04-22 DIAGNOSIS — E7800 Pure hypercholesterolemia, unspecified: Secondary | ICD-10-CM | POA: Diagnosis not present

## 2021-04-22 DIAGNOSIS — Z0000 Encounter for general adult medical examination without abnormal findings: Secondary | ICD-10-CM | POA: Diagnosis not present

## 2021-04-22 DIAGNOSIS — R001 Bradycardia, unspecified: Secondary | ICD-10-CM | POA: Diagnosis not present

## 2021-04-22 DIAGNOSIS — K219 Gastro-esophageal reflux disease without esophagitis: Secondary | ICD-10-CM | POA: Diagnosis not present

## 2021-04-22 DIAGNOSIS — I10 Essential (primary) hypertension: Secondary | ICD-10-CM | POA: Diagnosis not present

## 2021-04-28 ENCOUNTER — Encounter: Payer: Self-pay | Admitting: Podiatry

## 2021-04-28 ENCOUNTER — Ambulatory Visit (INDEPENDENT_AMBULATORY_CARE_PROVIDER_SITE_OTHER): Payer: Medicare Other | Admitting: Podiatry

## 2021-04-28 ENCOUNTER — Other Ambulatory Visit: Payer: Self-pay

## 2021-04-28 DIAGNOSIS — B351 Tinea unguium: Secondary | ICD-10-CM

## 2021-04-28 DIAGNOSIS — M79675 Pain in left toe(s): Secondary | ICD-10-CM

## 2021-04-28 DIAGNOSIS — M79674 Pain in right toe(s): Secondary | ICD-10-CM | POA: Diagnosis not present

## 2021-04-28 NOTE — Progress Notes (Signed)
Subjective:   Patient ID: Marcus Bruce, male   DOB: 80 y.o.   MRN: [id number]   HPI Patient presents stating he has significant thickness and elongation of nailbeds 1 through 5 of both feet and he cannot take care of them and they make it hard to wear shoe gear comfortably.  States that he is worried about eventually developing infection because of this and caregivers cannot take care of him either.  Patient does not smoke likes to be active   Review of Systems  All other systems reviewed and are negative.      Objective:  Physical Exam Vitals and nursing note reviewed.  Constitutional:      Appearance: He is well-developed.  Pulmonary:     Effort: Pulmonary effort is normal.  Musculoskeletal:        General: Normal range of motion.  Skin:    General: Skin is warm.  Neurological:     Mental Status: He is alert.    Neurovascular status was found to be intact muscle strength was found to be adequate range of motion adequate.  Patient is found to have severely thickened dystrophic nailbeds 1 through 5 both feet that become irritated in the corners and make shoe gear difficult.  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

## 2021-05-06 DIAGNOSIS — N1832 Chronic kidney disease, stage 3b: Secondary | ICD-10-CM | POA: Diagnosis not present

## 2021-05-10 DIAGNOSIS — R600 Localized edema: Secondary | ICD-10-CM | POA: Diagnosis not present

## 2021-05-10 DIAGNOSIS — N183 Chronic kidney disease, stage 3 unspecified: Secondary | ICD-10-CM | POA: Diagnosis not present

## 2021-05-10 DIAGNOSIS — D631 Anemia in chronic kidney disease: Secondary | ICD-10-CM | POA: Diagnosis not present

## 2021-05-10 DIAGNOSIS — N1832 Chronic kidney disease, stage 3b: Secondary | ICD-10-CM | POA: Diagnosis not present

## 2021-05-10 DIAGNOSIS — I129 Hypertensive chronic kidney disease with stage 1 through stage 4 chronic kidney disease, or unspecified chronic kidney disease: Secondary | ICD-10-CM | POA: Diagnosis not present

## 2021-06-30 ENCOUNTER — Other Ambulatory Visit (HOSPITAL_BASED_OUTPATIENT_CLINIC_OR_DEPARTMENT_OTHER): Payer: Self-pay

## 2021-06-30 ENCOUNTER — Ambulatory Visit: Payer: Medicare Other | Attending: Internal Medicine

## 2021-06-30 DIAGNOSIS — Z23 Encounter for immunization: Secondary | ICD-10-CM

## 2021-06-30 MED ORDER — PFIZER COVID-19 VAC BIVALENT 30 MCG/0.3ML IM SUSP
INTRAMUSCULAR | 0 refills | Status: DC
  Filled 2021-06-30: qty 0.3, 1d supply, fill #0

## 2021-06-30 NOTE — Progress Notes (Signed)
   Covid-19 Vaccination Clinic  Name:  Marcus Bruce    MRN: [id number] DOB: 1941-07-05  06/30/2021  Mr. Thielke was observed post Covid-19 immunization for 15 minutes without incident. He was provided with Vaccine Information Sheet and instruction to access the V-Safe system.   Mr. Bentsen was instructed to call 911 with any severe reactions post vaccine: Difficulty breathing  Swelling of face and throat  A fast heartbeat  A bad rash all over body  Dizziness and weakness   Immunizations Administered     Name Date Dose VIS Date Route   Pfizer Covid-19 Vaccine Bivalent Booster 06/30/2021 10:23 AM 0.3 mL 04/21/2021 Intramuscular   Manufacturer: [place]   Lot: [id number]   [place]: [phone]

## 2021-07-05 DIAGNOSIS — Z23 Encounter for immunization: Secondary | ICD-10-CM | POA: Diagnosis not present

## 2021-07-07 DIAGNOSIS — N183 Chronic kidney disease, stage 3 unspecified: Secondary | ICD-10-CM | POA: Diagnosis not present

## 2021-07-07 DIAGNOSIS — I10 Essential (primary) hypertension: Secondary | ICD-10-CM | POA: Diagnosis not present

## 2021-07-07 DIAGNOSIS — K219 Gastro-esophageal reflux disease without esophagitis: Secondary | ICD-10-CM | POA: Diagnosis not present

## 2021-07-07 DIAGNOSIS — N401 Enlarged prostate with lower urinary tract symptoms: Secondary | ICD-10-CM | POA: Diagnosis not present

## 2021-07-07 DIAGNOSIS — E7800 Pure hypercholesterolemia, unspecified: Secondary | ICD-10-CM | POA: Diagnosis not present

## 2021-07-07 DIAGNOSIS — D649 Anemia, unspecified: Secondary | ICD-10-CM | POA: Diagnosis not present

## 2021-07-26 ENCOUNTER — Other Ambulatory Visit: Payer: Self-pay

## 2021-07-26 ENCOUNTER — Encounter: Payer: Self-pay | Admitting: Podiatry

## 2021-07-26 ENCOUNTER — Ambulatory Visit (INDEPENDENT_AMBULATORY_CARE_PROVIDER_SITE_OTHER): Payer: Medicare Other | Admitting: Podiatry

## 2021-07-26 DIAGNOSIS — B351 Tinea unguium: Secondary | ICD-10-CM | POA: Diagnosis not present

## 2021-07-26 DIAGNOSIS — L608 Other nail disorders: Secondary | ICD-10-CM | POA: Diagnosis not present

## 2021-07-26 DIAGNOSIS — M79675 Pain in left toe(s): Secondary | ICD-10-CM

## 2021-07-26 DIAGNOSIS — M79674 Pain in right toe(s): Secondary | ICD-10-CM | POA: Diagnosis not present

## 2021-07-26 NOTE — Progress Notes (Signed)
This patient returns to the office for evaluation and treatment of long thick painful nails .  This patient is unable to trim his own nails since the patient cannot reach his feet.  Patient says the nails are painful walking and wearing his shoes.  He returns for preventive foot care services.  General Appearance  Alert, conversant and in no acute stress.  Vascular  Dorsalis pedis and posterior tibial  pulses are palpable  bilaterally.  Capillary return is within normal limits  bilaterally. Temperature is within normal limits  bilaterally.  Neurologic  Senn-Weinstein monofilament wire test within normal limits  bilaterally. Muscle power within normal limits bilaterally.  Nails Thick disfigured discolored nails with subungual debris  hallux nails  bilaterally. No evidence of bacterial infection or drainage bilaterally.  Orthopedic  No limitations of motion  feet .  No crepitus or effusions noted.  No bony pathology or digital deformities noted.  Skin  normotropic skin with no porokeratosis noted bilaterally.  No signs of infections or ulcers noted.     Onychomycosis  Pain in toes right foot  Pain in toes left foot  Debridement  of nails  1-5  B/L with a nail nipper.  Nails were then filed using a dremel tool with no incidents.    RTC  10  weeks    Alayziah Tangeman DPM   

## 2021-07-28 ENCOUNTER — Ambulatory Visit: Payer: Medicare Other | Admitting: Podiatry

## 2021-08-10 DIAGNOSIS — N1832 Chronic kidney disease, stage 3b: Secondary | ICD-10-CM | POA: Diagnosis not present

## 2021-08-18 DIAGNOSIS — I129 Hypertensive chronic kidney disease with stage 1 through stage 4 chronic kidney disease, or unspecified chronic kidney disease: Secondary | ICD-10-CM | POA: Diagnosis not present

## 2021-08-18 DIAGNOSIS — N1832 Chronic kidney disease, stage 3b: Secondary | ICD-10-CM | POA: Diagnosis not present

## 2021-08-18 DIAGNOSIS — D631 Anemia in chronic kidney disease: Secondary | ICD-10-CM | POA: Diagnosis not present

## 2021-08-18 DIAGNOSIS — N183 Chronic kidney disease, stage 3 unspecified: Secondary | ICD-10-CM | POA: Diagnosis not present

## 2021-09-18 ENCOUNTER — Encounter: Payer: Self-pay | Admitting: Podiatry

## 2021-09-18 ENCOUNTER — Other Ambulatory Visit: Payer: Self-pay | Admitting: Podiatry

## 2021-09-18 MED ORDER — CEPHALEXIN 500 MG PO CAPS: 500.0000 mg | ORAL_CAPSULE | Freq: Four times a day (QID) | ORAL | 0 refills | Status: AC

## 2021-09-20 ENCOUNTER — Encounter: Payer: Self-pay | Admitting: Podiatry

## 2021-09-20 ENCOUNTER — Other Ambulatory Visit: Payer: Self-pay

## 2021-09-20 ENCOUNTER — Ambulatory Visit (INDEPENDENT_AMBULATORY_CARE_PROVIDER_SITE_OTHER): Payer: Medicare Other | Admitting: Podiatry

## 2021-09-20 DIAGNOSIS — M79675 Pain in left toe(s): Secondary | ICD-10-CM | POA: Diagnosis not present

## 2021-09-20 DIAGNOSIS — L608 Other nail disorders: Secondary | ICD-10-CM | POA: Diagnosis not present

## 2021-09-20 DIAGNOSIS — L03119 Cellulitis of unspecified part of limb: Secondary | ICD-10-CM

## 2021-09-20 DIAGNOSIS — M79674 Pain in right toe(s): Secondary | ICD-10-CM

## 2021-09-20 DIAGNOSIS — B351 Tinea unguium: Secondary | ICD-10-CM | POA: Diagnosis not present

## 2021-09-20 DIAGNOSIS — L02619 Cutaneous abscess of unspecified foot: Secondary | ICD-10-CM

## 2021-09-20 NOTE — Progress Notes (Signed)
°  Subjective:  Patient ID: Marcus Bruce, male    DOB: 1941/02/25,   MRN: [id number]  Chief Complaint  Patient presents with   Foot Problem    Pt is here due to right great toe pain x 4days also mention R leg swelling     81 y.o. male presents for right great toe pain that started 4 days ago. Patient relates he also started to get some leg swelling. Called in over the weekend and related redness pain and swelling in the toe and leg. Cephalexin was called in for him. Relates it is doing better. Hoping to have his nails trimmed. Concerned about right great toe ingrown.  . Denies any other pedal complaints. Denies n/v/f/c.   History reviewed. No pertinent past medical history.  Objective:  Physical Exam: Vascular: DP/PT pulses 2/4 bilateral. CFT <3 seconds. Abormal hair growth on digits. Right lower extremitiy with edema.  Skin. No lacerations or abrasions bilateral feet. Nails 1-5 are thickened discolored and elongated with subungual debris.  No active signs of infection noted. Tender to medial border of right hallux nail.  Musculoskeletal: MMT 5/5 bilateral lower extremities in DF, PF, Inversion and Eversion. Deceased ROM in DF of ankle joint.  Neurological: Sensation intact to light touch.   Assessment:   1. Pain due to onychomycosis of toenails of both feet   2. Pincer nail deformity   3. Dermatophytosis of nail   4. Cellulitis and abscess of foot, except toes      Plan:  Patient was evaluated and treated and all questions answered. -Discussed and educated patient on foot care, especially with  regards to the vascular, neurological and musculoskeletal systems.  -Discussed supportive shoes at all times and checking feet regularly.  -Mechanically debrided all nails 1-5 bilateral using sterile nail nipper and filed with dremel without incident  -Continue course of Keflex  -Discussed compression.  -Answered all patient questions -Patient to return  as needed for foot care.   -Patient advised to call the office if any problems or questions arise in the meantime.   Lorenda Peck, DPM

## 2021-09-23 DIAGNOSIS — M7989 Other specified soft tissue disorders: Secondary | ICD-10-CM | POA: Diagnosis not present

## 2021-09-23 DIAGNOSIS — I82431 Acute embolism and thrombosis of right popliteal vein: Secondary | ICD-10-CM | POA: Diagnosis not present

## 2021-09-23 DIAGNOSIS — R001 Bradycardia, unspecified: Secondary | ICD-10-CM | POA: Diagnosis not present

## 2021-09-23 DIAGNOSIS — R600 Localized edema: Secondary | ICD-10-CM | POA: Diagnosis not present

## 2021-09-23 DIAGNOSIS — I440 Atrioventricular block, first degree: Secondary | ICD-10-CM | POA: Diagnosis not present

## 2021-09-23 DIAGNOSIS — Z5321 Procedure and treatment not carried out due to patient leaving prior to being seen by health care provider: Secondary | ICD-10-CM | POA: Diagnosis not present

## 2021-09-24 ENCOUNTER — Other Ambulatory Visit: Payer: Self-pay

## 2021-09-24 ENCOUNTER — Emergency Department (HOSPITAL_COMMUNITY)
Admission: EM | Admit: 2021-09-24 | Discharge: 2021-09-24 | Disposition: A | Discharge status: Home / Self Care | Payer: Medicare Other | Attending: Emergency Medicine | Admitting: Emergency Medicine

## 2021-09-24 ENCOUNTER — Encounter (HOSPITAL_COMMUNITY): Payer: Self-pay

## 2021-09-24 DIAGNOSIS — Z7901 Long term (current) use of anticoagulants: Secondary | ICD-10-CM | POA: Insufficient documentation

## 2021-09-24 DIAGNOSIS — I440 Atrioventricular block, first degree: Secondary | ICD-10-CM | POA: Diagnosis not present

## 2021-09-24 DIAGNOSIS — Z79899 Other long term (current) drug therapy: Secondary | ICD-10-CM | POA: Insufficient documentation

## 2021-09-24 DIAGNOSIS — N183 Chronic kidney disease, stage 3 unspecified: Secondary | ICD-10-CM | POA: Diagnosis not present

## 2021-09-24 DIAGNOSIS — I129 Hypertensive chronic kidney disease with stage 1 through stage 4 chronic kidney disease, or unspecified chronic kidney disease: Secondary | ICD-10-CM | POA: Diagnosis not present

## 2021-09-24 DIAGNOSIS — M7989 Other specified soft tissue disorders: Secondary | ICD-10-CM | POA: Diagnosis not present

## 2021-09-24 DIAGNOSIS — M79604 Pain in right leg: Secondary | ICD-10-CM | POA: Diagnosis not present

## 2021-09-24 DIAGNOSIS — I82431 Acute embolism and thrombosis of right popliteal vein: Secondary | ICD-10-CM | POA: Insufficient documentation

## 2021-09-24 HISTORY — DX: Disorder of kidney and ureter, unspecified: N28.9

## 2021-09-24 HISTORY — DX: Essential (primary) hypertension: I10

## 2021-09-24 MED ORDER — APIXABAN 5 MG PO TABS: 5.0000 mg | ORAL_TABLET | Freq: Two times a day (BID) | ORAL | 1 refills | Status: DC

## 2021-09-24 MED ORDER — APIXABAN 5 MG PO TABS: 5.0000 mg | ORAL_TABLET | Freq: Two times a day (BID) | ORAL | Status: DC

## 2021-09-24 MED ORDER — APIXABAN 5 MG PO TABS
10.0000 mg | ORAL_TABLET | Freq: Once | ORAL | Status: AC
  Administered 2021-09-24: 10 mg via ORAL
  Filled 2021-09-24: qty 2

## 2021-09-24 MED ORDER — APIXABAN (ELIQUIS) VTE STARTER PACK (10MG AND 5MG): ORAL_TABLET | ORAL | 0 refills | Status: DC

## 2021-09-24 MED ORDER — APIXABAN 5 MG PO TABS: 10.0000 mg | ORAL_TABLET | Freq: Two times a day (BID) | ORAL | Status: DC

## 2021-09-24 NOTE — ED Triage Notes (Signed)
Patient c/o right leg swelling and pain x 1 week. Patient was sent for a known blood clot behind his right knee. Patient states he had a venous doppler at [facility].

## 2021-09-24 NOTE — Progress Notes (Signed)
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 Pharmacy provided medication counseling and manufacturer coupon.   Labs obtained 2/2: Hgb 12.8, Plt 174, SCr 1.66 (hx of CKD)  Dose:  Apixaban 10 mg PO BID x 7 days followed by 5 mg PO BID.  Lenis Noon, PharmD 09/24/21 11:27 AM

## 2021-09-24 NOTE — Discharge Instructions (Addendum)
You have been given your first dose of Eliquis while in the ED. Please continue taking an additional dose tonight from your starter pack. As discussed below, you will take 10mg  twice a day for the next 7 days. On 10/01/21, you will switch to 5mg  twice daily for the next three months. I have prescribed you a starter pack that you will take for the first month. I have given you additional refills of the Eliquis to take twice daily for the two months after the completion of your starter pack. Please follow up with your PCP within the next week for re-evaluation and continued management. If you have any blurry vision, lightheadedness, chest pain, SOB, weakness on one side of your body please return to the nearest ER immediately.   Information on my medicine - ELIQUIS (apixaban)  This medication education was reviewed with me or my healthcare representative as part of my discharge preparation.    Why was Eliquis prescribed for you? Eliquis was prescribed to treat blood clots that may have been found in the veins of your legs (deep vein thrombosis) or in your lungs (pulmonary embolism) and to reduce the risk of them occurring again.  What do You need to know about Eliquis ? The starting dose is 10 mg (two 5 mg tablets) taken TWICE daily for the FIRST SEVEN (7) DAYS, then on 10/01/21  the dose is reduced to ONE 5 mg tablet taken TWICE daily.  Eliquis may be taken with or without food.   Try to take the dose about the same time in the morning and in the evening. If you have difficulty swallowing the tablet whole please discuss with your pharmacist how to take the medication safely.  Take Eliquis exactly as prescribed and DO NOT stop taking Eliquis without talking to the doctor who prescribed the medication.  Stopping may increase your risk of developing a new blood clot.  Refill your prescription before you run out.  After discharge, you should have regular check-up appointments with your healthcare  provider that is prescribing your Eliquis.    What do you do if you miss a dose? If a dose of ELIQUIS is not taken at the scheduled time, take it as soon as possible on the same day and twice-daily administration should be resumed. The dose should not be doubled to make up for a missed dose.  Important Safety Information A possible side effect of Eliquis is bleeding. You should call your healthcare provider right away if you experience any of the following: Bleeding from an injury or your nose that does not stop. Unusual colored urine (red or dark brown) or unusual colored stools (red or black). Unusual bruising for unknown reasons. A serious fall or if you hit your head (even if there is no bleeding).  Some medicines may interact with Eliquis and might increase your risk of bleeding or clotting while on Eliquis. To help avoid this, consult your healthcare provider or pharmacist prior to using any new prescription or non-prescription medications, including herbals, vitamins, non-steroidal anti-inflammatory drugs (NSAIDs) and supplements.  This website has more information on Eliquis (apixaban): http://[email or web address].com/eliquis/home

## 2021-09-24 NOTE — ED Provider Notes (Signed)
[place] DEPT Provider Note   CSN: [id number] Arrival date & time: 09/24/21  1324     History Chief Complaint  Patient presents with   blood clot    Marcus Bruce is a 81 y.o. male with h/o CKD III and HTN presents to the ED after confirmed right DVT on [place] yesterday. The patient reports that he has had RLE swelling for the past week.  The patient was recently seen at [facility] but left due to longer wait times.  HPI     Home Medications Prior to Admission medications   Medication Sig Start Date End Date Taking? Authorizing Provider  apixaban (ELIQUIS) 5 MG TABS tablet Take 1 tablet (5 mg total) by mouth 2 (two) times daily. 10/25/21 12/24/21 Yes Sherrell Puller, PA-C  APIXABAN Arne [place]) VTE STARTER PACK (10MG  AND 5MG ) Take as directed on package: start with two-5mg  tablets twice daily for 7 days. On day 8, switch to one-5mg  tablet twice daily. 09/24/21  Yes Sherrell Puller, PA-C  carvedilol (COREG) 12.5 MG tablet Take 12.5 mg by mouth 2 (two) times daily. 04/22/21   [provider]  cephALEXin (KEFLEX) 500 MG capsule Take 1 capsule (500 mg total) by mouth 4 (four) times daily for 7 days. 09/18/21 09/25/21  Lorenda Peck, MD  COVID-19 mRNA bivalent vaccine, Pfizer, (PFIZER COVID-19 VAC BIVALENT) injection Inject into the muscle. 06/30/21   Carlyle Basques, MD  COVID-19 mRNA Vac-TriS, Pfizer, SUSP injection Inject into the muscle. 12/09/20   Carlyle Basques, MD  famotidine (PEPCID) 40 MG tablet Take 40 mg by mouth daily. 02/23/21   [provider]  finasteride (PROSCAR) 5 MG tablet Take 5 mg by mouth daily. 04/02/21   [provider]  losartan-hydrochlorothiazide (HYZAAR) 100-12.5 MG tablet Take 1 tablet by mouth daily. 03/12/21   [provider]  NIFEdipine (ADALAT CC) 60 MG 24 hr tablet Take 60 mg by mouth daily. 02/23/21   [provider]  potassium chloride (KLOR-CON) 10 MEQ tablet Take 1 tablet by mouth daily. 02/03/21    [provider]  simvastatin (ZOCOR) 40 MG tablet SMARTSIG:1 Tablet(s) By Mouth Every Evening 02/23/21   [provider]      Allergies    Patient has no known allergies.    Review of Systems   Review of Systems  Constitutional:  Negative for chills and fever.  Respiratory:  Negative for cough, chest tightness and shortness of breath.   Cardiovascular:  Positive for leg swelling. Negative for chest pain.  Gastrointestinal:  Negative for abdominal pain.  Musculoskeletal:  Negative for back pain.   Physical Exam Updated Vital Signs BP 139/75    Temp 97.6 F (36.4 C) (Oral)    Resp 16    Ht 5\' 9"  (1.753 m)    Wt 72.6 kg    SpO2 100%    BMI 23.63 kg/m  Physical Exam Vitals and nursing note reviewed.  Constitutional:      General: He is not in acute distress.    Appearance: Normal appearance. He is not toxic-appearing.  Eyes:     General: No scleral icterus. Pulmonary:     Effort: Pulmonary effort is normal. No respiratory distress.     Breath sounds: Normal breath sounds. No wheezing.  Musculoskeletal:        General: Swelling present. No tenderness.     Comments: Swelling noted to the RLE. No overlying color changes, abrasions, lesions, or rash. DP and PT pulses palpable. Bandage over great toe.  Sensation intact bilaterally. Compartments soft. Negative Homan's sign. Cap refill < 3 seconds.  Skin:    General: Skin is dry.     Findings: No rash.  Neurological:     General: No focal deficit present.     Mental Status: He is alert. Mental status is at baseline.  Psychiatric:        Mood and Affect: Mood normal.    ED Results / Procedures / Treatments   Labs (all labs ordered are listed, but only abnormal results are displayed) Labs Reviewed - No data to display  EKG None  Radiology No results found.  Procedures Procedures   Medications Ordered in ED Medications  apixaban (ELIQUIS) tablet 10 mg (has no administration in time range)    ED Course/  Medical Decision Making/ A&P Clinical Course as of 09/25/21 0918  Fri Sep 24, 9133  5531 81 year old male sent in for positive duplex.  He has had some recent labs which show intact renal function.  We will set him up with Eliquis dose given today and outpatient medication.  Return instructions discussed [MB]    Clinical Course User Index [MB] Hayden Rasmussen, MD                           Medical Decision Making Risk Prescription drug management.   81 y/o M sent in to the ED for positive duplex showing DVT in the popliteal vein. Ultrasound from [facility] on 09/23/21 at 1427 showed there is evidence of acute deep venous thrombosis involving the right popliteal vein. Non-occlusive thrombus visualized. At this time, he also had labs drawn and showed his baseline Cr of 1.66. Mild anemia.   Low suspicion for PE at this time as the patient is not experiencing any SOB or chest pain.   Past chart investigation using Care Everywhere revealed that this patient has CKD III with a baseline Cr of 1.66. Pharmacy consulted and recommended Eliquis. Will start him on VTE starter pack and provide refills with coupon. Recommended follow up with his PCP within the week for re-eval. Bleeding and fall precautions given. Strict return precautions discussed. The patient agrees to plan. He is stable and being discharged home in good condition.   Final Clinical Impression(s) / ED Diagnoses Final diagnoses:  Acute deep vein thrombosis (DVT) of popliteal vein of right lower extremity ([place])    Rx / DC Orders ED Discharge Orders          Ordered    apixaban (ELIQUIS) 5 MG TABS tablet  2 times daily        09/24/21 1126    APIXABAN (ELIQUIS) VTE STARTER PACK (10MG  AND 5MG )        95/18/84 1126              Sherrell Puller, PA-C 09/25/21 0935    Hayden Rasmussen, MD 09/26/21 1104

## 2021-09-27 DIAGNOSIS — M7989 Other specified soft tissue disorders: Secondary | ICD-10-CM | POA: Diagnosis not present

## 2021-09-27 DIAGNOSIS — I82401 Acute embolism and thrombosis of unspecified deep veins of right lower extremity: Secondary | ICD-10-CM | POA: Diagnosis not present

## 2021-10-06 ENCOUNTER — Ambulatory Visit: Payer: Medicare Other | Admitting: Podiatry

## 2021-11-02 DIAGNOSIS — R600 Localized edema: Secondary | ICD-10-CM | POA: Diagnosis not present

## 2021-11-02 DIAGNOSIS — Z09 Encounter for follow-up examination after completed treatment for conditions other than malignant neoplasm: Secondary | ICD-10-CM | POA: Diagnosis not present

## 2021-11-02 DIAGNOSIS — I82401 Acute embolism and thrombosis of unspecified deep veins of right lower extremity: Secondary | ICD-10-CM | POA: Diagnosis not present

## 2021-12-20 DIAGNOSIS — N1832 Chronic kidney disease, stage 3b: Secondary | ICD-10-CM | POA: Diagnosis not present

## 2021-12-22 DIAGNOSIS — D631 Anemia in chronic kidney disease: Secondary | ICD-10-CM | POA: Diagnosis not present

## 2021-12-22 DIAGNOSIS — I82401 Acute embolism and thrombosis of unspecified deep veins of right lower extremity: Secondary | ICD-10-CM | POA: Diagnosis not present

## 2021-12-22 DIAGNOSIS — N183 Chronic kidney disease, stage 3 unspecified: Secondary | ICD-10-CM | POA: Diagnosis not present

## 2021-12-22 DIAGNOSIS — I129 Hypertensive chronic kidney disease with stage 1 through stage 4 chronic kidney disease, or unspecified chronic kidney disease: Secondary | ICD-10-CM | POA: Diagnosis not present

## 2021-12-22 DIAGNOSIS — N1832 Chronic kidney disease, stage 3b: Secondary | ICD-10-CM | POA: Diagnosis not present

## 2021-12-23 DIAGNOSIS — R42 Dizziness and giddiness: Secondary | ICD-10-CM | POA: Diagnosis not present

## 2021-12-23 DIAGNOSIS — M7989 Other specified soft tissue disorders: Secondary | ICD-10-CM | POA: Diagnosis not present

## 2021-12-23 DIAGNOSIS — Z86718 Personal history of other venous thrombosis and embolism: Secondary | ICD-10-CM | POA: Diagnosis not present

## 2021-12-23 DIAGNOSIS — Z09 Encounter for follow-up examination after completed treatment for conditions other than malignant neoplasm: Secondary | ICD-10-CM | POA: Diagnosis not present

## 2021-12-23 DIAGNOSIS — I10 Essential (primary) hypertension: Secondary | ICD-10-CM | POA: Diagnosis not present

## 2021-12-24 ENCOUNTER — Telehealth: Payer: Self-pay | Admitting: Podiatry

## 2021-12-24 NOTE — Telephone Encounter (Signed)
My account number is [id number] and I have a question about the service in terms of being billed for $170.  ?

## 2021-12-27 ENCOUNTER — Other Ambulatory Visit (HOSPITAL_COMMUNITY): Payer: Self-pay | Admitting: Family Medicine

## 2021-12-27 ENCOUNTER — Ambulatory Visit (HOSPITAL_COMMUNITY)
Admission: RE | Admit: 2021-12-27 | Discharge: 2021-12-27 | Disposition: A | Discharge status: Home / Self Care | Payer: Medicare Other | Source: Ambulatory Visit | Attending: Internal Medicine | Admitting: Internal Medicine

## 2021-12-27 DIAGNOSIS — M7989 Other specified soft tissue disorders: Secondary | ICD-10-CM

## 2021-12-31 DIAGNOSIS — R972 Elevated prostate specific antigen [PSA]: Secondary | ICD-10-CM | POA: Diagnosis not present

## 2021-12-31 DIAGNOSIS — R351 Nocturia: Secondary | ICD-10-CM | POA: Diagnosis not present

## 2021-12-31 DIAGNOSIS — N401 Enlarged prostate with lower urinary tract symptoms: Secondary | ICD-10-CM | POA: Diagnosis not present

## 2022-01-21 ENCOUNTER — Ambulatory Visit (INDEPENDENT_AMBULATORY_CARE_PROVIDER_SITE_OTHER): Payer: Medicare Other | Admitting: Podiatry

## 2022-01-21 ENCOUNTER — Encounter: Payer: Self-pay | Admitting: Podiatry

## 2022-01-21 DIAGNOSIS — M79675 Pain in left toe(s): Secondary | ICD-10-CM

## 2022-01-21 DIAGNOSIS — Z79899 Other long term (current) drug therapy: Secondary | ICD-10-CM | POA: Insufficient documentation

## 2022-01-21 DIAGNOSIS — B351 Tinea unguium: Secondary | ICD-10-CM

## 2022-01-21 DIAGNOSIS — L608 Other nail disorders: Secondary | ICD-10-CM | POA: Diagnosis not present

## 2022-01-21 DIAGNOSIS — Z1213 Encounter for screening for malignant neoplasm of small intestine: Secondary | ICD-10-CM | POA: Insufficient documentation

## 2022-01-21 DIAGNOSIS — E739 Lactose intolerance, unspecified: Secondary | ICD-10-CM | POA: Insufficient documentation

## 2022-01-21 DIAGNOSIS — I10 Essential (primary) hypertension: Secondary | ICD-10-CM | POA: Insufficient documentation

## 2022-01-21 DIAGNOSIS — N401 Enlarged prostate with lower urinary tract symptoms: Secondary | ICD-10-CM | POA: Insufficient documentation

## 2022-01-21 DIAGNOSIS — K219 Gastro-esophageal reflux disease without esophagitis: Secondary | ICD-10-CM | POA: Insufficient documentation

## 2022-01-21 DIAGNOSIS — D649 Anemia, unspecified: Secondary | ICD-10-CM | POA: Insufficient documentation

## 2022-01-21 DIAGNOSIS — M79674 Pain in right toe(s): Secondary | ICD-10-CM | POA: Diagnosis not present

## 2022-01-21 DIAGNOSIS — Z86718 Personal history of other venous thrombosis and embolism: Secondary | ICD-10-CM | POA: Diagnosis not present

## 2022-01-21 DIAGNOSIS — E7800 Pure hypercholesterolemia, unspecified: Secondary | ICD-10-CM | POA: Insufficient documentation

## 2022-01-21 NOTE — Progress Notes (Signed)
This patient returns to the office for evaluation and treatment of long thick painful nails .  This patient is unable to trim his own nails since the patient cannot reach his feet.  Patient says the nails are painful walking and wearing his shoes.  He returns for preventive foot care services.  General Appearance  Alert, conversant and in no acute stress.  Vascular  Dorsalis pedis and posterior tibial  pulses are palpable  bilaterally.  Capillary return is within normal limits  bilaterally. Temperature is within normal limits  bilaterally.  Neurologic  Senn-Weinstein monofilament wire test within normal limits  bilaterally. Muscle power within normal limits bilaterally.  Nails Thick disfigured discolored nails with subungual debris  hallux nails  bilaterally. No evidence of bacterial infection or drainage bilaterally.  Orthopedic  No limitations of motion  feet .  No crepitus or effusions noted.  No bony pathology or digital deformities noted.  Skin  normotropic skin with no porokeratosis noted bilaterally.  No signs of infections or ulcers noted.     Onychomycosis  Pain in toes right foot  Pain in toes left foot  Debridement  of nails  1-5  B/L with a nail nipper.  Nails were then filed using a dremel tool with no incidents.    RTC  12  weeks    Gardiner Barefoot DPM

## 2022-02-21 DIAGNOSIS — H2513 Age-related nuclear cataract, bilateral: Secondary | ICD-10-CM | POA: Diagnosis not present

## 2022-02-21 DIAGNOSIS — H25013 Cortical age-related cataract, bilateral: Secondary | ICD-10-CM | POA: Diagnosis not present

## 2022-03-07 DIAGNOSIS — E7800 Pure hypercholesterolemia, unspecified: Secondary | ICD-10-CM | POA: Diagnosis not present

## 2022-03-07 DIAGNOSIS — K219 Gastro-esophageal reflux disease without esophagitis: Secondary | ICD-10-CM | POA: Diagnosis not present

## 2022-03-07 DIAGNOSIS — N183 Chronic kidney disease, stage 3 unspecified: Secondary | ICD-10-CM | POA: Diagnosis not present

## 2022-03-07 DIAGNOSIS — I10 Essential (primary) hypertension: Secondary | ICD-10-CM | POA: Diagnosis not present

## 2022-03-07 DIAGNOSIS — D649 Anemia, unspecified: Secondary | ICD-10-CM | POA: Diagnosis not present

## 2022-03-07 DIAGNOSIS — N401 Enlarged prostate with lower urinary tract symptoms: Secondary | ICD-10-CM | POA: Diagnosis not present

## 2022-03-08 DIAGNOSIS — L2089 Other atopic dermatitis: Secondary | ICD-10-CM | POA: Diagnosis not present

## 2022-03-08 DIAGNOSIS — L218 Other seborrheic dermatitis: Secondary | ICD-10-CM | POA: Diagnosis not present

## 2022-03-16 DIAGNOSIS — N1832 Chronic kidney disease, stage 3b: Secondary | ICD-10-CM | POA: Diagnosis not present

## 2022-03-21 DIAGNOSIS — N179 Acute kidney failure, unspecified: Secondary | ICD-10-CM | POA: Diagnosis not present

## 2022-03-21 DIAGNOSIS — I129 Hypertensive chronic kidney disease with stage 1 through stage 4 chronic kidney disease, or unspecified chronic kidney disease: Secondary | ICD-10-CM | POA: Diagnosis not present

## 2022-03-21 DIAGNOSIS — N1832 Chronic kidney disease, stage 3b: Secondary | ICD-10-CM | POA: Diagnosis not present

## 2022-03-21 DIAGNOSIS — N183 Chronic kidney disease, stage 3 unspecified: Secondary | ICD-10-CM | POA: Diagnosis not present

## 2022-03-21 DIAGNOSIS — D631 Anemia in chronic kidney disease: Secondary | ICD-10-CM | POA: Diagnosis not present

## 2022-03-22 ENCOUNTER — Encounter: Payer: Self-pay | Admitting: Licensed Clinical Social Worker

## 2022-03-22 ENCOUNTER — Ambulatory Visit: Payer: Self-pay | Admitting: Licensed Clinical Social Worker

## 2022-03-22 NOTE — Patient Outreach (Signed)
  Care Coordination  Initial Visit Note   03/22/2022 Name: Marcus Bruce MRN: [id number] DOB: [birth year]  Marcus Bruce is a 81 y.o. year old male who sees Marcus Cruel, MD for primary care. I spoke with  Marcus Bruce by phone today  What matters to the patients health and wellness today?  Advocating for self and staying on top of his health No needs identified   Goals Addressed   None    SDOH assessments and interventions completed:   Yes SDOH Interventions Today    Flowsheet Row Most Recent Value  SDOH Interventions   Food Insecurity Interventions Intervention Not Indicated  Housing Interventions Intervention Not Indicated  Transportation Interventions Intervention Not Indicated      Care Coordination Interventions Activated:  Yes Care Coordination Interventions:  Yes, provided review care gaps and made referral to QC to update.  Follow up plan: No further intervention required. Patient does not desire ongoing follow up.  Encounter Outcome:  Pt. Visit Completed  Marcus Bruce, [place] [phone]

## 2022-03-22 NOTE — Patient Instructions (Signed)
Visit Information  Thank you for taking time to visit with me today. Please don't hesitate to contact me if I can be of assistance to you.   No goals were discussed today:   Goals Addressed   None    Please call the care guide team at [phone] if you need to cancel or reschedule your appointment.   If you are experiencing a Mental Health or [place] or need someone to talk to, please go to [facility] [place] [phone])   The patient verbalized understanding of instructions, educational materials, and care plan provided today and DECLINED offer to receive copy of patient instructions, educational materials, and care plan.   No further follow up required: you do not desire continued follow up  Marcus Bruce, [name] [phone]

## 2022-04-04 DIAGNOSIS — N179 Acute kidney failure, unspecified: Secondary | ICD-10-CM | POA: Diagnosis not present

## 2022-04-29 ENCOUNTER — Encounter: Payer: Self-pay | Admitting: Podiatry

## 2022-04-29 ENCOUNTER — Ambulatory Visit (INDEPENDENT_AMBULATORY_CARE_PROVIDER_SITE_OTHER): Payer: Medicare Other | Admitting: Podiatry

## 2022-04-29 DIAGNOSIS — M79675 Pain in left toe(s): Secondary | ICD-10-CM

## 2022-04-29 DIAGNOSIS — B351 Tinea unguium: Secondary | ICD-10-CM

## 2022-04-29 DIAGNOSIS — L608 Other nail disorders: Secondary | ICD-10-CM

## 2022-04-29 DIAGNOSIS — Z86718 Personal history of other venous thrombosis and embolism: Secondary | ICD-10-CM | POA: Diagnosis not present

## 2022-04-29 DIAGNOSIS — M79674 Pain in right toe(s): Secondary | ICD-10-CM | POA: Diagnosis not present

## 2022-04-29 NOTE — Progress Notes (Signed)
This patient returns to the office for evaluation and treatment of long thick painful nails .  This patient is unable to trim his own nails since the patient cannot reach his feet.  Patient says the nails are painful walking and wearing his shoes.  He returns for preventive foot care services.  General Appearance  Alert, conversant and in no acute stress.  Vascular  Dorsalis pedis and posterior tibial  pulses are palpable  bilaterally.  Capillary return is within normal limits  bilaterally. Temperature is within normal limits  bilaterally.  Neurologic  Senn-Weinstein monofilament wire test within normal limits  bilaterally. Muscle power within normal limits bilaterally.  Nails Thick disfigured discolored nails with subungual debris  hallux nails  bilaterally. No evidence of bacterial infection or drainage bilaterally.  Orthopedic  No limitations of motion  feet .  No crepitus or effusions noted.  No bony pathology or digital deformities noted.  Skin  normotropic skin with no porokeratosis noted bilaterally.  No signs of infections or ulcers noted.     Onychomycosis  Pain in toes right foot  Pain in toes left foot  Debridement  of nails  1-5  B/L with a nail nipper.  Nails were then filed using a dremel tool with no incidents.    RTC  10  weeks    Nayomi Tabron DPM   

## 2022-05-05 DIAGNOSIS — E7800 Pure hypercholesterolemia, unspecified: Secondary | ICD-10-CM | POA: Diagnosis not present

## 2022-05-12 DIAGNOSIS — R413 Other amnesia: Secondary | ICD-10-CM | POA: Diagnosis not present

## 2022-05-12 DIAGNOSIS — Z86718 Personal history of other venous thrombosis and embolism: Secondary | ICD-10-CM | POA: Diagnosis not present

## 2022-05-12 DIAGNOSIS — Z0000 Encounter for general adult medical examination without abnormal findings: Secondary | ICD-10-CM | POA: Diagnosis not present

## 2022-05-12 DIAGNOSIS — N183 Chronic kidney disease, stage 3 unspecified: Secondary | ICD-10-CM | POA: Diagnosis not present

## 2022-05-12 DIAGNOSIS — Z6824 Body mass index (BMI) 24.0-24.9, adult: Secondary | ICD-10-CM | POA: Diagnosis not present

## 2022-05-12 DIAGNOSIS — E7800 Pure hypercholesterolemia, unspecified: Secondary | ICD-10-CM | POA: Diagnosis not present

## 2022-05-12 DIAGNOSIS — K219 Gastro-esophageal reflux disease without esophagitis: Secondary | ICD-10-CM | POA: Diagnosis not present

## 2022-05-12 DIAGNOSIS — I10 Essential (primary) hypertension: Secondary | ICD-10-CM | POA: Diagnosis not present

## 2022-05-31 DIAGNOSIS — Z23 Encounter for immunization: Secondary | ICD-10-CM | POA: Diagnosis not present

## 2022-06-20 DIAGNOSIS — N1832 Chronic kidney disease, stage 3b: Secondary | ICD-10-CM | POA: Diagnosis not present

## 2022-06-22 DIAGNOSIS — D631 Anemia in chronic kidney disease: Secondary | ICD-10-CM | POA: Diagnosis not present

## 2022-06-22 DIAGNOSIS — N1832 Chronic kidney disease, stage 3b: Secondary | ICD-10-CM | POA: Diagnosis not present

## 2022-06-22 DIAGNOSIS — I129 Hypertensive chronic kidney disease with stage 1 through stage 4 chronic kidney disease, or unspecified chronic kidney disease: Secondary | ICD-10-CM | POA: Diagnosis not present

## 2022-06-22 DIAGNOSIS — N183 Chronic kidney disease, stage 3 unspecified: Secondary | ICD-10-CM | POA: Diagnosis not present

## 2022-07-13 ENCOUNTER — Ambulatory Visit: Payer: Medicare Other | Admitting: Podiatry

## 2022-07-18 ENCOUNTER — Encounter: Payer: Self-pay | Admitting: Podiatry

## 2022-07-18 ENCOUNTER — Ambulatory Visit (INDEPENDENT_AMBULATORY_CARE_PROVIDER_SITE_OTHER): Payer: Medicare Other | Admitting: Podiatry

## 2022-07-18 DIAGNOSIS — M79675 Pain in left toe(s): Secondary | ICD-10-CM | POA: Diagnosis not present

## 2022-07-18 DIAGNOSIS — M79674 Pain in right toe(s): Secondary | ICD-10-CM | POA: Diagnosis not present

## 2022-07-18 DIAGNOSIS — L608 Other nail disorders: Secondary | ICD-10-CM

## 2022-07-18 DIAGNOSIS — Z86718 Personal history of other venous thrombosis and embolism: Secondary | ICD-10-CM

## 2022-07-18 DIAGNOSIS — B351 Tinea unguium: Secondary | ICD-10-CM | POA: Diagnosis not present

## 2022-07-18 NOTE — Progress Notes (Signed)
This patient returns to the office for evaluation and treatment of long thick painful nails .  This patient is unable to trim his own nails since the patient cannot reach his feet.  Patient says the nails are painful walking and wearing his shoes.  He returns for preventive foot care services.  General Appearance  Alert, conversant and in no acute stress.  Vascular  Dorsalis pedis and posterior tibial  pulses are palpable  bilaterally.  Capillary return is within normal limits  bilaterally. Temperature is within normal limits  bilaterally.  Neurologic  Senn-Weinstein monofilament wire test within normal limits  bilaterally. Muscle power within normal limits bilaterally.  Nails Thick disfigured discolored nails with subungual debris  hallux nails  bilaterally. No evidence of bacterial infection or drainage bilaterally.  Orthopedic  No limitations of motion  feet .  No crepitus or effusions noted.  No bony pathology or digital deformities noted.  Skin  normotropic skin with no porokeratosis noted bilaterally.  No signs of infections or ulcers noted.     Onychomycosis  Pain in toes right foot  Pain in toes left foot  Debridement  of nails  1-5  B/L with a nail nipper.  Nails were then filed using a dremel tool with no incidents.    RTC  10  weeks    Gardiner Barefoot DPM

## 2022-09-26 DIAGNOSIS — N1832 Chronic kidney disease, stage 3b: Secondary | ICD-10-CM | POA: Diagnosis not present

## 2022-09-28 ENCOUNTER — Ambulatory Visit (INDEPENDENT_AMBULATORY_CARE_PROVIDER_SITE_OTHER): Payer: Medicare Other | Admitting: Podiatry

## 2022-09-28 ENCOUNTER — Encounter: Payer: Self-pay | Admitting: Podiatry

## 2022-09-28 VITALS — BP 150/75 | HR 118

## 2022-09-28 DIAGNOSIS — B351 Tinea unguium: Secondary | ICD-10-CM

## 2022-09-28 DIAGNOSIS — M79675 Pain in left toe(s): Secondary | ICD-10-CM

## 2022-09-28 DIAGNOSIS — I129 Hypertensive chronic kidney disease with stage 1 through stage 4 chronic kidney disease, or unspecified chronic kidney disease: Secondary | ICD-10-CM | POA: Diagnosis not present

## 2022-09-28 DIAGNOSIS — M79674 Pain in right toe(s): Secondary | ICD-10-CM | POA: Diagnosis not present

## 2022-09-28 DIAGNOSIS — L608 Other nail disorders: Secondary | ICD-10-CM

## 2022-09-28 DIAGNOSIS — D631 Anemia in chronic kidney disease: Secondary | ICD-10-CM | POA: Diagnosis not present

## 2022-09-28 DIAGNOSIS — N183 Chronic kidney disease, stage 3 unspecified: Secondary | ICD-10-CM | POA: Diagnosis not present

## 2022-09-28 DIAGNOSIS — N1832 Chronic kidney disease, stage 3b: Secondary | ICD-10-CM | POA: Diagnosis not present

## 2022-09-28 DIAGNOSIS — Z86718 Personal history of other venous thrombosis and embolism: Secondary | ICD-10-CM

## 2022-09-28 NOTE — Progress Notes (Signed)
This patient returns to the office for evaluation and treatment of long thick painful nails .  This patient is unable to trim his own nails since the patient cannot reach his feet.  Patient says the nails are painful walking and wearing his shoes.  He returns for preventive foot care services.  General Appearance  Alert, conversant and in no acute stress.  Vascular  Dorsalis pedis and posterior tibial  pulses are palpable  bilaterally.  Capillary return is within normal limits  bilaterally. Temperature is within normal limits  bilaterally.  Neurologic  Senn-Weinstein monofilament wire test within normal limits  bilaterally. Muscle power within normal limits bilaterally.  Nails Thick disfigured discolored nails with subungual debris  hallux nails  bilaterally. No evidence of bacterial infection or drainage bilaterally.  Orthopedic  No limitations of motion  feet .  No crepitus or effusions noted.  No bony pathology or digital deformities noted.  Skin  normotropic skin with no porokeratosis noted bilaterally.  No signs of infections or ulcers noted.     Onychomycosis  Pain in toes right foot  Pain in toes left foot  Debridement  of nails  1-5  B/L with a nail nipper.  Nails were then filed using a dremel tool with no incidents.    RTC  10  weeks    Gardiner Barefoot DPM

## 2022-12-01 DIAGNOSIS — D649 Anemia, unspecified: Secondary | ICD-10-CM | POA: Diagnosis not present

## 2022-12-01 DIAGNOSIS — E7800 Pure hypercholesterolemia, unspecified: Secondary | ICD-10-CM | POA: Diagnosis not present

## 2022-12-01 DIAGNOSIS — N401 Enlarged prostate with lower urinary tract symptoms: Secondary | ICD-10-CM | POA: Diagnosis not present

## 2022-12-01 DIAGNOSIS — I10 Essential (primary) hypertension: Secondary | ICD-10-CM | POA: Diagnosis not present

## 2022-12-01 DIAGNOSIS — K219 Gastro-esophageal reflux disease without esophagitis: Secondary | ICD-10-CM | POA: Diagnosis not present

## 2022-12-01 DIAGNOSIS — N183 Chronic kidney disease, stage 3 unspecified: Secondary | ICD-10-CM | POA: Diagnosis not present

## 2022-12-07 ENCOUNTER — Encounter: Payer: Self-pay | Admitting: Podiatry

## 2022-12-07 ENCOUNTER — Ambulatory Visit (INDEPENDENT_AMBULATORY_CARE_PROVIDER_SITE_OTHER): Payer: Medicare Other | Admitting: Podiatry

## 2022-12-07 VITALS — BP 170/80 | HR 42

## 2022-12-07 DIAGNOSIS — L608 Other nail disorders: Secondary | ICD-10-CM

## 2022-12-07 DIAGNOSIS — B351 Tinea unguium: Secondary | ICD-10-CM

## 2022-12-07 DIAGNOSIS — M79674 Pain in right toe(s): Secondary | ICD-10-CM | POA: Diagnosis not present

## 2022-12-07 DIAGNOSIS — M79675 Pain in left toe(s): Secondary | ICD-10-CM | POA: Diagnosis not present

## 2022-12-07 NOTE — Progress Notes (Signed)
This patient returns to the office for evaluation and treatment of long thick painful nails .  This patient is unable to trim his own nails since the patient cannot reach his feet.  Patient says the nails are painful walking and wearing his shoes.  He returns for preventive foot care services.  General Appearance  Alert, conversant and in no acute stress.  Vascular  Dorsalis pedis and posterior tibial  pulses are palpable  bilaterally.  Capillary return is within normal limits  bilaterally. Temperature is within normal limits  bilaterally.  Neurologic  Senn-Weinstein monofilament wire test within normal limits  bilaterally. Muscle power within normal limits bilaterally.  Nails Thick disfigured discolored nails with subungual debris  hallux nails  bilaterally. No evidence of bacterial infection or drainage bilaterally.  Orthopedic  No limitations of motion  feet .  No crepitus or effusions noted.  No bony pathology or digital deformities noted.  Skin  normotropic skin with no porokeratosis noted bilaterally.  No signs of infections or ulcers noted.     Onychomycosis  Pain in toes right foot  Pain in toes left foot  Debridement  of nails  1-5  B/L with a nail nipper.  Nails were then filed using a dremel tool with no incidents.    RTC  10 weeks    Carnel Stegman DPM   

## 2022-12-22 DIAGNOSIS — R413 Other amnesia: Secondary | ICD-10-CM | POA: Diagnosis not present

## 2022-12-22 DIAGNOSIS — F05 Delirium due to known physiological condition: Secondary | ICD-10-CM | POA: Diagnosis not present

## 2022-12-22 DIAGNOSIS — Z6823 Body mass index (BMI) 23.0-23.9, adult: Secondary | ICD-10-CM | POA: Diagnosis not present

## 2022-12-29 DIAGNOSIS — N1832 Chronic kidney disease, stage 3b: Secondary | ICD-10-CM | POA: Diagnosis not present

## 2023-01-02 DIAGNOSIS — N1832 Chronic kidney disease, stage 3b: Secondary | ICD-10-CM | POA: Diagnosis not present

## 2023-01-02 DIAGNOSIS — D631 Anemia in chronic kidney disease: Secondary | ICD-10-CM | POA: Diagnosis not present

## 2023-01-02 DIAGNOSIS — I129 Hypertensive chronic kidney disease with stage 1 through stage 4 chronic kidney disease, or unspecified chronic kidney disease: Secondary | ICD-10-CM | POA: Diagnosis not present

## 2023-02-15 ENCOUNTER — Ambulatory Visit (INDEPENDENT_AMBULATORY_CARE_PROVIDER_SITE_OTHER): Payer: Medicare Other | Admitting: Podiatry

## 2023-02-15 ENCOUNTER — Encounter: Payer: Self-pay | Admitting: Podiatry

## 2023-02-15 DIAGNOSIS — B351 Tinea unguium: Secondary | ICD-10-CM | POA: Diagnosis not present

## 2023-02-15 DIAGNOSIS — M79674 Pain in right toe(s): Secondary | ICD-10-CM

## 2023-02-15 DIAGNOSIS — L608 Other nail disorders: Secondary | ICD-10-CM

## 2023-02-15 DIAGNOSIS — M79675 Pain in left toe(s): Secondary | ICD-10-CM

## 2023-02-15 NOTE — Progress Notes (Signed)
This patient returns to the office for evaluation and treatment of long thick painful nails .  This patient is unable to trim his own nails since the patient cannot reach his feet.  Patient says the nails are painful walking and wearing his shoes.  He returns for preventive foot care services.  General Appearance  Alert, conversant and in no acute stress.  Vascular  Dorsalis pedis and posterior tibial  pulses are palpable  bilaterally.  Capillary return is within normal limits  bilaterally. Temperature is within normal limits  bilaterally.  Neurologic  Senn-Weinstein monofilament wire test within normal limits  bilaterally. Muscle power within normal limits bilaterally.  Nails Thick disfigured discolored nails with subungual debris  hallux nails  bilaterally. No evidence of bacterial infection or drainage bilaterally.  Orthopedic  No limitations of motion  feet .  No crepitus or effusions noted.  No bony pathology or digital deformities noted.  Skin  normotropic skin with no porokeratosis noted bilaterally.  No signs of infections or ulcers noted.     Onychomycosis  Pain in toes right foot  Pain in toes left foot  Debridement  of nails  1-5  B/L with a nail nipper.  Nails were then filed using a dremel tool with no incidents.    RTC  10 weeks    Wendy Mikles DPM   

## 2023-03-02 ENCOUNTER — Ambulatory Visit: Payer: Medicare Other | Admitting: Neurology

## 2023-03-10 DIAGNOSIS — R351 Nocturia: Secondary | ICD-10-CM | POA: Diagnosis not present

## 2023-03-10 DIAGNOSIS — N401 Enlarged prostate with lower urinary tract symptoms: Secondary | ICD-10-CM | POA: Diagnosis not present

## 2023-03-10 DIAGNOSIS — R972 Elevated prostate specific antigen [PSA]: Secondary | ICD-10-CM | POA: Diagnosis not present

## 2023-04-04 ENCOUNTER — Telehealth: Payer: Self-pay | Admitting: Neurology

## 2023-04-04 ENCOUNTER — Encounter: Payer: Self-pay | Admitting: Neurology

## 2023-04-04 ENCOUNTER — Ambulatory Visit (INDEPENDENT_AMBULATORY_CARE_PROVIDER_SITE_OTHER): Payer: Medicare Other | Admitting: Neurology

## 2023-04-04 VITALS — BP 148/70 | HR 42 | Ht 69.0 in | Wt 156.0 lb

## 2023-04-04 DIAGNOSIS — G4733 Obstructive sleep apnea (adult) (pediatric): Secondary | ICD-10-CM

## 2023-04-04 DIAGNOSIS — R5383 Other fatigue: Secondary | ICD-10-CM | POA: Diagnosis not present

## 2023-04-04 DIAGNOSIS — R0902 Hypoxemia: Secondary | ICD-10-CM

## 2023-04-04 DIAGNOSIS — R443 Hallucinations, unspecified: Secondary | ICD-10-CM

## 2023-04-04 DIAGNOSIS — E519 Thiamine deficiency, unspecified: Secondary | ICD-10-CM | POA: Diagnosis not present

## 2023-04-04 DIAGNOSIS — F22 Delusional disorders: Secondary | ICD-10-CM | POA: Diagnosis not present

## 2023-04-04 DIAGNOSIS — E538 Deficiency of other specified B group vitamins: Secondary | ICD-10-CM

## 2023-04-04 DIAGNOSIS — G309 Alzheimer's disease, unspecified: Secondary | ICD-10-CM

## 2023-04-04 NOTE — Telephone Encounter (Signed)
Send an ONO to his home overnight for OSA and hypoxemia please
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
## 2023-04-04 NOTE — Telephone Encounter (Signed)
Order placed

## 2023-04-04 NOTE — Addendum Note (Signed)
Addended by: Jacqualine Code D on: 04/04/2023 02:36 PM   Modules accepted: Orders

## 2023-04-04 NOTE — Telephone Encounter (Signed)
error 

## 2023-04-04 NOTE — Patient Instructions (Addendum)
MRI brain Bloodwork EEG Dr. Kieth Brightly - formal memory testing Overnight oxygen monitor and pulse monitor MAY consider Pet Amyloid Scan which looks for amyloid in the brain which is a marker for alzheimers Treatment: Donepezil, Namenda, Lecanemab and Donanemab which we can discuss at next appointment  Dementia Dementia is a condition that affects the way the brain works. It often affects thinking and memory.  There are many types of dementia, including: Alzheimer's disease. This is the most common type. Vascular dementia. This type may happen due to a stroke. Lewy body dementia. This type may happen to people who have Parkinson's disease. Frontotemporal dementia. This type is caused by damage to nerve cells in certain parts of the brain. Some people may have more than one type. What are the causes? Dementia is caused by damage to cells in the brain. Some causes that can't be reversed include: Having a condition that affects the blood vessels of the brain. This may be diabetes or heart disease. Changes to genes. Some causes that can be reversed or slowed down include: Injury to the brain due to: A growth called a tumor. A blood clot. Too much fluid in the brain. Taking certain medicines. An infection. Problems with your thyroid. Not having enough vitamin B12 in the body. Having a disease that causes your body's defense system, called the immune system, to attack healthy parts of your body. What are the signs or symptoms? Symptoms of dementia start slowly and get worse with time. They may include: Problems remembering events or people. Getting lost easily. Forgetting appointments or to pay bills. Having trouble taking a bath or putting clothes on. Having trouble planning and making meals. Having trouble speaking. Changes in behavior or mood. How is this diagnosed? Dementia may be diagnosed based on: Your symptoms and medical history. A physical exam. Tests. These may  include: Tests to check your thinking and memory to see how your brain is working. Lab tests. You may have tests on your blood or pee (urine). Imaging tests, such as a CT scan, a PET scan, or an MRI. Genetic testing. This may be done if other family members have had dementia. Your health care provider will talk with you and your family, friends, or caregivers about your history and symptoms. How is this treated? Treatment depends on the cause of the dementia and should start as soon as possible. It might include: Taking medicines for symptoms. Taking medicines to help control or slow down the dementia. Treating the cause of your dementia. Your provider can help you find support groups and other members of the health care team who can help with your care. Follow these instructions at home: Medicines Take medicines only as told by your provider. Use a pill organizer or pill reminder to help you keep track of your medicines. Avoid taking medicines for pain or for sleep. These can affect your thinking. Lifestyle Make healthy choices. Be active as told by your provider. Do not smoke, vape, or use products with nicotine or tobacco in them. If you need help quitting, talk with your provider. Do not drink alcohol. When you feel a lot of stress, do something that helps you relax. Your provider can give you tips. Spend time with other people. Make sure you get good sleep at night. These tips can help: Try not to take naps during the day. Keep your bedroom dark and cool. Do not exercise in the few hours before you go to bed. Do not have foods or drinks  with caffeine at night. Eating and drinking Drink enough fluid to keep your pee pale yellow. Eat a healthy diet. General instructions  Talk with your provider to decide on: What things you need help with. What your safety needs are. Ask your provider if it's safe for you to drive. If told, wear a bracelet that tracks where you are or shows  that you're a person with memory loss. Work with your family to make big legal or health decisions. This may include things like advance directives, medical power of attorney, or a living will. Where to find more information Alzheimer's Association: WesternTunes.it [employer] on Aging: BaseRingTones.pl World Health Organization: VisitDestination.com.br Contact a health care provider if: You have any new symptoms. Your symptoms get worse. You have problems with swallowing. Get help right away if: You feel very sad or feel like you may hurt yourself or others. You have thoughts about taking your own life. Your family members are worried about your safety. These symptoms may be an emergency. Take one of these steps right away: Go to your nearest emergency room. Call 911. Call the National Suicide Prevention Lifeline at 6158355752 or 988. Text the Crisis Text Line at 640-346-3925. This information is not intended to replace advice given to you by your health care provider. Make sure you discuss any questions you have with your health care provider. Document Revised: 10/24/2022 Document Reviewed: 10/24/2022 Elsevier Patient Education  2024 [employer].

## 2023-04-05 ENCOUNTER — Telehealth: Payer: Self-pay | Admitting: Neurology

## 2023-04-05 NOTE — Telephone Encounter (Signed)
medicare/[employer] NPR sent to GI [phone] 

## 2023-04-07 ENCOUNTER — Ambulatory Visit: Admission: RE | Admit: 2023-04-07 | Payer: Medicare Other | Source: Ambulatory Visit

## 2023-04-07 DIAGNOSIS — F22 Delusional disorders: Secondary | ICD-10-CM

## 2023-04-07 DIAGNOSIS — R9082 White matter disease, unspecified: Secondary | ICD-10-CM | POA: Diagnosis not present

## 2023-04-07 DIAGNOSIS — R443 Hallucinations, unspecified: Secondary | ICD-10-CM

## 2023-04-07 DIAGNOSIS — G309 Alzheimer's disease, unspecified: Secondary | ICD-10-CM

## 2023-04-07 DIAGNOSIS — R5383 Other fatigue: Secondary | ICD-10-CM

## 2023-04-07 DIAGNOSIS — G319 Degenerative disease of nervous system, unspecified: Secondary | ICD-10-CM | POA: Diagnosis not present

## 2023-04-10 ENCOUNTER — Telehealth: Payer: Self-pay | Admitting: *Deleted

## 2023-04-10 DIAGNOSIS — N1832 Chronic kidney disease, stage 3b: Secondary | ICD-10-CM | POA: Diagnosis not present

## 2023-04-10 NOTE — Telephone Encounter (Signed)
Spoke to patient and wife gave labwork and MRI brain results Gave Dr. Trevor Mace recommendation to f/u with PCP due to results of lab work and proceed with testing to get a complete diagnosis. Pt asked about EEG Wednesday gave him the time of test . Pt and wife asked  to send results over to PCP . Pt and wife expressed understanding and thanked me for calling . Sent tests results to PCP this afternoon

## 2023-04-10 NOTE — Telephone Encounter (Signed)
-----   Message from Anson Fret sent at 04/09/2023  2:47 PM EDT ----- Please let caretaker know: There are blood marker's in the testing consistent with an alzheimer's pathology of his cognitive complaints. This does not mean he has alzheimer's disease but he is at higher risk and we need to complete the workup for an anser. As far as his other labs, he has CKD and mild anemia which is not new/ fu with primary care for this. He continue the workup with the formal memory testing we ordered and then we will see him at follow up. Thank you

## 2023-04-12 ENCOUNTER — Ambulatory Visit (INDEPENDENT_AMBULATORY_CARE_PROVIDER_SITE_OTHER): Payer: Medicare Other | Admitting: Neurology

## 2023-04-12 DIAGNOSIS — G309 Alzheimer's disease, unspecified: Secondary | ICD-10-CM

## 2023-04-12 DIAGNOSIS — R443 Hallucinations, unspecified: Secondary | ICD-10-CM

## 2023-04-12 DIAGNOSIS — D631 Anemia in chronic kidney disease: Secondary | ICD-10-CM | POA: Diagnosis not present

## 2023-04-12 DIAGNOSIS — F22 Delusional disorders: Secondary | ICD-10-CM

## 2023-04-12 DIAGNOSIS — I129 Hypertensive chronic kidney disease with stage 1 through stage 4 chronic kidney disease, or unspecified chronic kidney disease: Secondary | ICD-10-CM | POA: Diagnosis not present

## 2023-04-12 DIAGNOSIS — N1832 Chronic kidney disease, stage 3b: Secondary | ICD-10-CM | POA: Diagnosis not present

## 2023-04-12 DIAGNOSIS — R4182 Altered mental status, unspecified: Secondary | ICD-10-CM

## 2023-04-13 DIAGNOSIS — Z6824 Body mass index (BMI) 24.0-24.9, adult: Secondary | ICD-10-CM | POA: Diagnosis not present

## 2023-04-13 DIAGNOSIS — G309 Alzheimer's disease, unspecified: Secondary | ICD-10-CM | POA: Diagnosis not present

## 2023-04-13 NOTE — Procedures (Signed)
   History:  82 year old gentleman with events concerning for seizure   EEG classification:  Awake and asleep  Duration: 25 minutes  Technical aspects: This EEG study was done with scalp electrodes positioned according to the 10-20 International system of electrode placement. Electrical activity was reviewed with band pass filter of 1-70Hz , sensitivity of 7 uV/mm, display speed of 76mm/sec with a 60Hz  notched filter applied as appropriate. EEG data were recorded continuously and digitally stored.   Description of the recording: The background rhythms of this recording consists of a fairly well modulated medium amplitude background activity of 8-9 Hz. As the record progresses, the patient initially is in the waking state, but appears to enter the early stage II sleep during the recording, with rudimentary sleep spindles and vertex sharp wave activity seen. During the wakeful state, photic stimulation was performed, and no abnormal responses were seen. Hyperventilation was also performed, no abnormal response seen. No epileptiform discharges seen during this recording. There was no focal slowing.   Abnormality: None   Impression: This is a normal EEG recording in the waking and sleeping state. No evidence of interictal epileptiform discharges. Normal EEGs, however, do not rule out epilepsy.    Windell Norfolk, MD [facility]

## 2023-04-17 DIAGNOSIS — H2513 Age-related nuclear cataract, bilateral: Secondary | ICD-10-CM | POA: Diagnosis not present

## 2023-04-18 ENCOUNTER — Encounter: Payer: Self-pay | Admitting: Psychology

## 2023-04-18 ENCOUNTER — Telehealth: Payer: Self-pay

## 2023-04-18 NOTE — Telephone Encounter (Signed)
-----   Message from Anson Fret sent at 04/17/2023  5:21 PM EDT ----- Eeg is normal, thanks dr Lucia Gaskins

## 2023-04-18 NOTE — Telephone Encounter (Signed)
I called and spoke with the patient (and his wife, via speaker phone). I informed him of normal EEG results. They verbalized understanding and expressed appreciation for the call.

## 2023-04-28 ENCOUNTER — Ambulatory Visit (INDEPENDENT_AMBULATORY_CARE_PROVIDER_SITE_OTHER): Payer: Medicare Other | Admitting: Podiatry

## 2023-04-28 DIAGNOSIS — B351 Tinea unguium: Secondary | ICD-10-CM | POA: Diagnosis not present

## 2023-04-28 DIAGNOSIS — M79675 Pain in left toe(s): Secondary | ICD-10-CM | POA: Diagnosis not present

## 2023-04-28 DIAGNOSIS — L608 Other nail disorders: Secondary | ICD-10-CM | POA: Diagnosis not present

## 2023-04-28 DIAGNOSIS — M79674 Pain in right toe(s): Secondary | ICD-10-CM

## 2023-04-28 NOTE — Progress Notes (Signed)
This patient returns to the office for evaluation and treatment of long thick painful nails .  This patient is unable to trim his own nails since the patient cannot reach his feet.  Patient says the nails are painful walking and wearing his shoes.  He returns for preventive foot care services.  General Appearance  Alert, conversant and in no acute stress.  Vascular  Dorsalis pedis and posterior tibial  pulses are palpable  bilaterally.  Capillary return is within normal limits  bilaterally. Temperature is within normal limits  bilaterally.  Neurologic  Senn-Weinstein monofilament wire test within normal limits  bilaterally. Muscle power within normal limits bilaterally.  Nails Thick disfigured discolored nails with subungual debris  hallux nails  bilaterally. No evidence of bacterial infection or drainage bilaterally.  Orthopedic  No limitations of motion  feet .  No crepitus or effusions noted.  No bony pathology or digital deformities noted.  Skin  normotropic skin with no porokeratosis noted bilaterally.  No signs of infections or ulcers noted.     Onychomycosis  Pain in toes right foot  Pain in toes left foot  Debridement  of nails  1-5  B/L with a nail nipper.  Nails were then filed using a dremel tool with no incidents.    RTC  10  weeks    Gregory Mayer DPM   

## 2023-05-01 DIAGNOSIS — Z23 Encounter for immunization: Secondary | ICD-10-CM | POA: Diagnosis not present

## 2023-06-01 DIAGNOSIS — Z0000 Encounter for general adult medical examination without abnormal findings: Secondary | ICD-10-CM | POA: Diagnosis not present

## 2023-06-01 DIAGNOSIS — Z86718 Personal history of other venous thrombosis and embolism: Secondary | ICD-10-CM | POA: Diagnosis not present

## 2023-06-01 DIAGNOSIS — Z6824 Body mass index (BMI) 24.0-24.9, adult: Secondary | ICD-10-CM | POA: Diagnosis not present

## 2023-06-01 DIAGNOSIS — Z79899 Other long term (current) drug therapy: Secondary | ICD-10-CM | POA: Diagnosis not present

## 2023-06-01 DIAGNOSIS — N1832 Chronic kidney disease, stage 3b: Secondary | ICD-10-CM | POA: Diagnosis not present

## 2023-06-01 DIAGNOSIS — E7800 Pure hypercholesterolemia, unspecified: Secondary | ICD-10-CM | POA: Diagnosis not present

## 2023-06-01 DIAGNOSIS — I10 Essential (primary) hypertension: Secondary | ICD-10-CM | POA: Diagnosis not present

## 2023-06-01 DIAGNOSIS — K219 Gastro-esophageal reflux disease without esophagitis: Secondary | ICD-10-CM | POA: Diagnosis not present

## 2023-06-29 ENCOUNTER — Telehealth: Payer: Self-pay | Admitting: Neurology

## 2023-06-29 ENCOUNTER — Ambulatory Visit (INDEPENDENT_AMBULATORY_CARE_PROVIDER_SITE_OTHER): Payer: Medicare Other | Admitting: Neurology

## 2023-06-29 ENCOUNTER — Encounter: Payer: Self-pay | Admitting: Neurology

## 2023-06-29 VITALS — BP 154/66 | HR 44 | Ht 68.0 in | Wt 156.4 lb

## 2023-06-29 DIAGNOSIS — G309 Alzheimer's disease, unspecified: Secondary | ICD-10-CM

## 2023-06-29 DIAGNOSIS — G3184 Mild cognitive impairment, so stated: Secondary | ICD-10-CM

## 2023-06-29 NOTE — Patient Instructions (Addendum)
CT PET Amyloid in the brain Marcus Bruce for memory testing Then we can discuss medication if needed/wanted

## 2023-06-29 NOTE — Telephone Encounter (Signed)
error 
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
## 2023-07-02 ENCOUNTER — Telehealth: Payer: Self-pay | Admitting: Neurology

## 2023-07-02 NOTE — Telephone Encounter (Signed)
Pod4: Not sure if this should be completed by referral or slow not, but Dr. Kieth Brightly is scheduling out in June 2025.  Patient has an appointment in June 2025 but I placed a new referral to [facility].  If Eileen [facility] can get them in sooner, I would really like to make sure that Dr. Marvetta Gibbons appointment is canceled to give another patient the opportunity for that appointment.  Would you work with referrals to see if Eile[facility] is scheduling out sooner and if so to make sure that Dr. Marvetta Gibbons appointment is canceled for patient?  Thank you.

## 2023-07-03 NOTE — Telephone Encounter (Signed)
Marcus Bruce, When you send this referral to Marcus Bruce's office, can you find out if she's scheduling sooner than June 2025?  Let us know and then we can follow-up with the patient to cancel Marcus Bruce appt once patient is successfully scheduled with Marcus  Rueben Bruce, I am not sure if there will be any issues with pt's insurance being in vs out of network with Marcus Bruce. If you find that out, let me know as well. Thank you!

## 2023-07-04 ENCOUNTER — Telehealth: Payer: Self-pay | Admitting: Neurology

## 2023-07-04 NOTE — Telephone Encounter (Signed)
Referral for neuropsychology fax to [facility]. Phone: [phone], Fax: [phone]. 

## 2023-07-06 ENCOUNTER — Telehealth: Payer: Self-pay | Admitting: Neurology

## 2023-07-06 NOTE — Telephone Encounter (Signed)
no auth required sent to [facility] [phone]

## 2023-07-10 ENCOUNTER — Ambulatory Visit (INDEPENDENT_AMBULATORY_CARE_PROVIDER_SITE_OTHER): Payer: Medicare Other | Admitting: Podiatry

## 2023-07-10 ENCOUNTER — Encounter: Payer: Self-pay | Admitting: Podiatry

## 2023-07-10 DIAGNOSIS — M79675 Pain in left toe(s): Secondary | ICD-10-CM

## 2023-07-10 DIAGNOSIS — M79674 Pain in right toe(s): Secondary | ICD-10-CM | POA: Diagnosis not present

## 2023-07-10 DIAGNOSIS — L608 Other nail disorders: Secondary | ICD-10-CM

## 2023-07-10 DIAGNOSIS — B351 Tinea unguium: Secondary | ICD-10-CM

## 2023-07-10 NOTE — Progress Notes (Signed)
This patient returns to the office for evaluation and treatment of long thick painful nails .  This patient is unable to trim his own nails since the patient cannot reach his feet.  Patient says the nails are painful walking and wearing his shoes.  He returns for preventive foot care services.  General Appearance  Alert, conversant and in no acute stress.  Vascular  Dorsalis pedis and posterior tibial  pulses are palpable  bilaterally.  Capillary return is within normal limits  bilaterally. Temperature is within normal limits  bilaterally.  Neurologic  Senn-Weinstein monofilament wire test within normal limits  bilaterally. Muscle power within normal limits bilaterally.  Nails Thick disfigured discolored nails with subungual debris  hallux nails  bilaterally. No evidence of bacterial infection or drainage bilaterally.  Orthopedic  No limitations of motion  feet .  No crepitus or effusions noted.  No bony pathology or digital deformities noted.  Skin  normotropic skin with no porokeratosis noted bilaterally.  No signs of infections or ulcers noted.     Onychomycosis  Pain in toes right foot  Pain in toes left foot  Debridement  of nails  1-5  B/L with a nail nipper.  Nails were then filed using a dremel tool with no incidents.    RTC  10  weeks    Alayziah Tangeman DPM   

## 2023-07-18 ENCOUNTER — Encounter (HOSPITAL_COMMUNITY): Payer: Medicare Other

## 2023-07-24 ENCOUNTER — Encounter (HOSPITAL_COMMUNITY)
Admission: RE | Admit: 2023-07-24 | Discharge: 2023-07-24 | Disposition: A | Discharge status: Home / Self Care | Payer: Medicare Other | Source: Ambulatory Visit | Attending: Neurology | Admitting: Neurology

## 2023-07-24 DIAGNOSIS — F0390 Unspecified dementia without behavioral disturbance: Secondary | ICD-10-CM | POA: Diagnosis not present

## 2023-07-24 DIAGNOSIS — G309 Alzheimer's disease, unspecified: Secondary | ICD-10-CM | POA: Insufficient documentation

## 2023-07-24 MED ORDER — FLORBETAPIR F 18 500-1900 MBQ/ML IV SOLN
10.3100 | Freq: Once | INTRAVENOUS | Status: AC
  Administered 2023-07-24: 10.31 via INTRAVENOUS

## 2023-07-31 DIAGNOSIS — N1832 Chronic kidney disease, stage 3b: Secondary | ICD-10-CM | POA: Diagnosis not present

## 2023-08-01 ENCOUNTER — Telehealth: Payer: Self-pay | Admitting: *Deleted

## 2023-08-01 NOTE — Telephone Encounter (Signed)
I called the pt and spoke with him and his wife Marcus Bruce (on [place]). I discussed the results of the CT scan as noted below. They verbalized understanding and their questions were answered. Formal memory testing is scheduled to occur on 09/18/23 and pt will follow-up in office with Dr Lucia Gaskins on 10/13/23. Can discuss pet scan results in more detail at that time as well. They verbalized appreciation for the call.

## 2023-08-01 NOTE — Telephone Encounter (Signed)
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 But he should still follow through with [facility] for the formal neurocognitive testing.

## 2023-08-02 DIAGNOSIS — I129 Hypertensive chronic kidney disease with stage 1 through stage 4 chronic kidney disease, or unspecified chronic kidney disease: Secondary | ICD-10-CM | POA: Diagnosis not present

## 2023-08-02 DIAGNOSIS — N1832 Chronic kidney disease, stage 3b: Secondary | ICD-10-CM | POA: Diagnosis not present

## 2023-08-17 DIAGNOSIS — K1120 Sialoadenitis, unspecified: Secondary | ICD-10-CM | POA: Diagnosis not present

## 2023-09-18 DIAGNOSIS — R413 Other amnesia: Secondary | ICD-10-CM | POA: Diagnosis not present

## 2023-09-20 ENCOUNTER — Encounter: Payer: Self-pay | Admitting: Podiatry

## 2023-09-20 ENCOUNTER — Ambulatory Visit (INDEPENDENT_AMBULATORY_CARE_PROVIDER_SITE_OTHER): Payer: Medicare Other | Admitting: Podiatry

## 2023-09-20 DIAGNOSIS — M79674 Pain in right toe(s): Secondary | ICD-10-CM

## 2023-09-20 DIAGNOSIS — Z86718 Personal history of other venous thrombosis and embolism: Secondary | ICD-10-CM

## 2023-09-20 DIAGNOSIS — L608 Other nail disorders: Secondary | ICD-10-CM | POA: Diagnosis not present

## 2023-09-20 DIAGNOSIS — B351 Tinea unguium: Secondary | ICD-10-CM | POA: Diagnosis not present

## 2023-09-20 DIAGNOSIS — M79675 Pain in left toe(s): Secondary | ICD-10-CM

## 2023-09-20 NOTE — Progress Notes (Signed)
This patient returns to the office for evaluation and treatment of long thick painful nails .  This patient is unable to trim his own nails since the patient cannot reach his feet.  Patient says the nails are painful walking and wearing his shoes.  He returns for preventive foot care services.  General Appearance  Alert, conversant and in no acute stress.  Vascular  Dorsalis pedis and posterior tibial  pulses are palpable  bilaterally.  Capillary return is within normal limits  bilaterally. Temperature is within normal limits  bilaterally.  Neurologic  Senn-Weinstein monofilament wire test within normal limits  bilaterally. Muscle power within normal limits bilaterally.  Nails Thick disfigured discolored nails with subungual debris  hallux nails  bilaterally. No evidence of bacterial infection or drainage bilaterally.  Orthopedic  No limitations of motion  feet .  No crepitus or effusions noted.  No bony pathology or digital deformities noted.  Skin  normotropic skin with no porokeratosis noted bilaterally.  No signs of infections or ulcers noted.     Onychomycosis  Pain in toes right foot  Pain in toes left foot  Debridement  of nails  1-5  B/L with a nail nipper.  Nails were then filed using a dremel tool with no incidents.    RTC  10  weeks    Helane Gunther DPM

## 2023-10-04 DIAGNOSIS — R413 Other amnesia: Secondary | ICD-10-CM | POA: Diagnosis not present

## 2023-10-13 ENCOUNTER — Encounter: Payer: Self-pay | Admitting: Neurology

## 2023-10-13 ENCOUNTER — Ambulatory Visit (INDEPENDENT_AMBULATORY_CARE_PROVIDER_SITE_OTHER): Payer: Medicare Other | Admitting: Neurology

## 2023-10-13 VITALS — BP 163/72 | HR 44 | Ht 69.0 in | Wt 157.2 lb

## 2023-10-13 DIAGNOSIS — G3184 Mild cognitive impairment, so stated: Secondary | ICD-10-CM | POA: Diagnosis not present
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
## 2023-10-13 NOTE — Patient Instructions (Addendum)
 Aricept/Donepezil would not try because of low heart rate But would recommend Namenda/memantine to slow down memory loss   Problems With Thinking and Memory (Mild Neurocognitive Disorder): What to Know Mild neurocognitive disorder, formerly known as mild cognitive impairment, is a disorder where your memory doesn't work as well as it should. It may also affect other mental abilities like thinking, communicating, behavior, and being able to finish tasks. These problems can be noticed and measured. But they usually don't stop you from doing daily activities or living on your own. Mild neurocognitive disorder usually happens after 81 years of age. But it can also happen at younger ages. It's not as serious as major neurocognitive disorder, also known as dementia, but it may be the first sign of it. In general, the symptoms of this condition get worse over time. In rare cases, symptoms can get better. What are the causes? This condition may be caused by: Brain disorders like Alzheimer's disease, Parkinson's disease, and other conditions that slowly damage nerve cells. Diseases that affect the blood vessels in the brain and cause small strokes. Certain infections, like HIV. Traumatic brain injury. Other medical conditions, such as brain tumors, underactive thyroid (hypothyroidism), and not having enough vitamin B12. Using certain drugs or medicines. What increases the risk? Being older than 81 years of age. Being male. Having a lower level of education. Diabetes, high blood pressure, high cholesterol, and other conditions that raise the risk for blood vessel diseases. Untreated or undertreated sleep apnea. Having a certain type of gene that can be inherited, or passed down from parent to child. Long-term health problems like heart disease, lung disease, liver disease, kidney disease, or depression. What are the signs or symptoms? Trouble remembering things. You may: Forget names, phone numbers,  or details of recent events. Forget about social events and appointments. Often forget where you put your car keys or other items. Trouble thinking and solving problems. You may have trouble with complex tasks like: Paying bills. Driving in places you don't know well. Trouble communicating. You may have trouble: Finding the right word or naming an object. Forming a sentence that makes sense. Understanding what you read or hear. Changes in your behavior or personality. When this happens, you may: Lose interest in the things you used to enjoy. Avoid being around people. Get angry more easily than usual. Act before thinking. How is this diagnosed? This condition is diagnosed based on: Your symptoms. Your health care provider may ask you and the people you spend time with, like family and friends, about your symptoms. Memory tests and other tests to check how your brain is working. Your provider may refer you to a provider called a neurologist or a mental health specialist. To try to find out the cause of your condition, your provider may: Get a detailed medical history. Ask about use of alcohol, drugs, and medicines. Do a physical exam. Order blood tests and brain imaging tests. How is this treated? Mild neurocognitive disorder that's caused by medicine use, drug use, infection, or another medical condition may get better when the cause is treated, or when medicines or drugs are stopped. If this disorder has another cause, it usually doesn't improve and may get worse. In these cases, the goal of treatment is to help you manage the symptoms. This may include: Medicines to help with memory and behavior symptoms. Talk therapy. This provides education, emotional support, memory aids, and other ways of making up for problems with mental tasks. Lifestyle changes. These  may include: Getting regular exercise. Eating a healthy diet that includes omega-3 fatty acids. Doing things to challenge your  thinking and memory skills. Spending more time being with and talking to other people. Using routines like having regular times for meals and going to bed. Follow these instructions at home: Eating and drinking  Drink more fluids as told. Eat a healthy diet that includes omega-3 fatty acids. These can be found in: Fish. Nuts. Leafy vegetables. Vegetable oils. If you drink alcohol: Limit how much you have to: 0-1 drink a day if you're male. 0-2 drinks a day if you're male. Know how much alcohol is in your drink. In the [place] one drink is one 12 oz bottle of beer (355 mL), one 5 oz glass of wine (148 mL), or one 1 oz glass of hard liquor (44 mL). Lifestyle  Get regular exercise as told by your provider. Do not smoke, vape, or use nicotine or tobacco. Use healthy ways to manage stress. If you need help managing stress, ask your provider. Keep spending time with other people. Keep your mind active by doing activities you enjoy, like reading or playing games. Make sure you get good sleep at night. These tips can help: Try not to take naps during the day. Keep your bedroom dark and cool. Do not exercise in the few hours before you go to bed. Do not have foods or drinks with caffeine at night. General instructions Take medicines only as told. Your provider may tell you to avoid taking medicines that can affect thinking. These include some medicines for pain or sleeping. Work with your provider to find out: What things you need help with. What your safety needs are. Where to find more information [employer] on Aging: [email or web address] Contact a health care provider if: You have any new symptoms. Get help right away if: You have new confusion or your confusion gets worse. You act in ways that put you or your family in danger. This information is not intended to replace advice given to you by your health care provider. Make sure you discuss any questions you have with your health  care provider. Document Revised: 01/31/2023 Document Reviewed: 01/31/2023 Elsevier Patient Education  2024 Elsevier Inc. Memantine Tablets What is this medication? MEMANTINE (MEM an teen) treats memory loss and confusion (dementia) in people who have Alzheimer disease. It works by improving attention, memory, and the ability to engage in daily activities. It is not a cure for dementia or Alzheimer disease. This medicine may be used for other purposes; ask your health care provider or pharmacist if you have questions. COMMON BRAND NAME(S): Namenda What should I tell my care team before I take this medication? They need to know if you have any of these conditions: Kidney disease Liver disease Seizures Trouble passing urine An unusual or allergic reaction to memantine, other medications, foods, dyes, or preservatives Pregnant or trying to get pregnant Breast-feeding How should I use this medication? Take this medication by mouth with water. Follow the directions on the prescription label. You may take this medication with or without food. Take your doses at regular intervals. Do not take your medication more often than directed. Continue to take your medication even if you feel better. Do not stop taking except on the advice of your care team. Talk to your care team about the use of this medication in children. Special care may be needed Overdosage: If you think you have taken too much of this medicine contact a  poison control center or emergency room at once. NOTE: This medicine is only for you. Do not share this medicine with others. What if I miss a dose? If you miss a dose, take it as soon as you can. If it is almost time for your next dose, take only that dose. Do not take double or extra doses. If you do not take your medication for several days, contact your care team. Your dose may need to be changed. What may interact with this  medication? Acetazolamide Amantadine Cimetidine Dextromethorphan Dofetilide Hydrochlorothiazide Ketamine Metformin Methazolamide Quinidine Ranitidine Sodium bicarbonate Triamterene This list may not describe all possible interactions. Give your health care provider a list of all the medicines, herbs, non-prescription drugs, or dietary supplements you use. Also tell them if you smoke, drink alcohol, or use illegal drugs. Some items may interact with your medicine. What should I watch for while using this medication? Visit your care team for regular checks on your progress. Check with your care team if there is no improvement in your symptoms or if they get worse. This medication may affect your coordination, reaction time, or judgment. Do not drive or operate machinery until you know how this medication affects you. Sit up or stand slowly to reduce the risk of dizzy or fainting spells. Drinking alcohol with this medication can increase the risk of these side effects. What side effects may I notice from receiving this medication? Side effects that you should report to your care team as soon as possible: Allergic reactions--skin rash, itching, hives, swelling of the face, lips, tongue, or throat Side effects that usually do not require medical attention (report to your care team if they continue or are bothersome): Confusion Constipation Diarrhea Dizziness Headache This list may not describe all possible side effects. Call your doctor for medical advice about side effects. You may report side effects to FDA at 1-800-FDA-1088. Where should I keep my medication? Keep out of the reach of children. Store at room temperature between 15 degrees and 30 degrees C (59 degrees and 86 degrees F). Throw away any unused medication after the expiration date. NOTE: This sheet is a summary. It may not cover all possible information. If you have questions about this medicine, talk to your doctor,  pharmacist, or health care provider.  2024 Elsevier/Gold Standard (2021-08-31 00:00:00)

## 2023-11-29 ENCOUNTER — Ambulatory Visit (INDEPENDENT_AMBULATORY_CARE_PROVIDER_SITE_OTHER): Payer: Medicare Other | Admitting: Podiatry

## 2023-11-29 ENCOUNTER — Encounter: Payer: Self-pay | Admitting: Podiatry

## 2023-11-29 DIAGNOSIS — L608 Other nail disorders: Secondary | ICD-10-CM | POA: Diagnosis not present

## 2023-11-29 DIAGNOSIS — N1832 Chronic kidney disease, stage 3b: Secondary | ICD-10-CM | POA: Diagnosis not present

## 2023-11-29 DIAGNOSIS — Z86718 Personal history of other venous thrombosis and embolism: Secondary | ICD-10-CM | POA: Diagnosis not present

## 2023-11-29 DIAGNOSIS — M79675 Pain in left toe(s): Secondary | ICD-10-CM

## 2023-11-29 DIAGNOSIS — B351 Tinea unguium: Secondary | ICD-10-CM | POA: Diagnosis not present

## 2023-11-29 DIAGNOSIS — M79674 Pain in right toe(s): Secondary | ICD-10-CM | POA: Diagnosis not present

## 2023-11-29 NOTE — Progress Notes (Signed)
 This patient returns to the office for evaluation and treatment of long thick painful nails .  This patient is unable to trim his own nails since the patient cannot reach his feet.  Patient says the nails are painful walking and wearing his shoes.  He returns for preventive foot care services.  General Appearance  Alert, conversant and in no acute stress.  Vascular  Dorsalis pedis and posterior tibial  pulses are palpable  bilaterally.  Capillary return is within normal limits  bilaterally. Temperature is within normal limits  bilaterally.  Neurologic  Senn-Weinstein monofilament wire test within normal limits  bilaterally. Muscle power within normal limits bilaterally.  Nails Thick disfigured discolored nails with subungual debris  hallux nails  bilaterally. No evidence of bacterial infection or drainage bilaterally.  Orthopedic  No limitations of motion  feet .  No crepitus or effusions noted.  No bony pathology or digital deformities noted.  Skin  normotropic skin with no porokeratosis noted bilaterally.  No signs of infections or ulcers noted.     Onychomycosis  Pain in toes right foot  Pain in toes left foot  Debridement  of nails  1-5  B/L with a nail nipper.  Nails were then filed using a dremel tool with no incidents.    RTC  10  weeks    Helane Gunther DPM

## 2023-12-04 DIAGNOSIS — D631 Anemia in chronic kidney disease: Secondary | ICD-10-CM | POA: Diagnosis not present

## 2023-12-04 DIAGNOSIS — N1832 Chronic kidney disease, stage 3b: Secondary | ICD-10-CM | POA: Diagnosis not present

## 2023-12-04 DIAGNOSIS — I129 Hypertensive chronic kidney disease with stage 1 through stage 4 chronic kidney disease, or unspecified chronic kidney disease: Secondary | ICD-10-CM | POA: Diagnosis not present

## 2024-01-29 DIAGNOSIS — R0789 Other chest pain: Secondary | ICD-10-CM | POA: Diagnosis not present

## 2024-01-31 ENCOUNTER — Encounter: Payer: Medicare Other | Attending: Psychology | Admitting: Psychology

## 2024-02-07 ENCOUNTER — Encounter: Payer: Self-pay | Admitting: Podiatry

## 2024-02-07 ENCOUNTER — Ambulatory Visit (INDEPENDENT_AMBULATORY_CARE_PROVIDER_SITE_OTHER): Admitting: Podiatry

## 2024-02-07 DIAGNOSIS — M79674 Pain in right toe(s): Secondary | ICD-10-CM | POA: Diagnosis not present

## 2024-02-07 DIAGNOSIS — M79675 Pain in left toe(s): Secondary | ICD-10-CM | POA: Diagnosis not present

## 2024-02-07 DIAGNOSIS — B351 Tinea unguium: Secondary | ICD-10-CM

## 2024-02-07 DIAGNOSIS — L608 Other nail disorders: Secondary | ICD-10-CM

## 2024-02-07 NOTE — Progress Notes (Signed)
 This patient returns to the office for evaluation and treatment of long thick painful nails .  This patient is unable to trim his own nails since the patient cannot reach his feet.  Patient says the nails are painful walking and wearing his shoes.  He returns for preventive foot care services.  General Appearance  Alert, conversant and in no acute stress.  Vascular  Dorsalis pedis and posterior tibial  pulses are palpable  bilaterally.  Capillary return is within normal limits  bilaterally. Temperature is within normal limits  bilaterally.  Neurologic  Senn-Weinstein monofilament wire test within normal limits  bilaterally. Muscle power within normal limits bilaterally.  Nails Thick disfigured discolored nails with subungual debris  hallux nails  bilaterally. No evidence of bacterial infection or drainage bilaterally.  Orthopedic  No limitations of motion  feet .  No crepitus or effusions noted.  No bony pathology or digital deformities noted.  Skin  normotropic skin with no porokeratosis noted bilaterally.  No signs of infections or ulcers noted.     Onychomycosis  Pain in toes right foot  Pain in toes left foot  Debridement  of nails  1-5  B/L with a nail nipper.  Nails were then filed using a dremel tool with no incidents.    RTC  10  weeks    Helane Gunther DPM

## 2024-04-04 DIAGNOSIS — N1832 Chronic kidney disease, stage 3b: Secondary | ICD-10-CM | POA: Diagnosis not present

## 2024-04-08 DIAGNOSIS — I129 Hypertensive chronic kidney disease with stage 1 through stage 4 chronic kidney disease, or unspecified chronic kidney disease: Secondary | ICD-10-CM | POA: Diagnosis not present

## 2024-04-08 DIAGNOSIS — R001 Bradycardia, unspecified: Secondary | ICD-10-CM | POA: Diagnosis not present

## 2024-04-08 DIAGNOSIS — R7989 Other specified abnormal findings of blood chemistry: Secondary | ICD-10-CM | POA: Diagnosis not present

## 2024-04-08 DIAGNOSIS — N1832 Chronic kidney disease, stage 3b: Secondary | ICD-10-CM | POA: Diagnosis not present

## 2024-04-08 DIAGNOSIS — D631 Anemia in chronic kidney disease: Secondary | ICD-10-CM | POA: Diagnosis not present

## 2024-04-11 NOTE — Progress Notes (Unsigned)
 PATIENT: Marcus Bruce DOB: 15-Oct-1940  REASON FOR VISIT: follow up HISTORY FROM: patient PRIMARY NEUROLOGIST: Dr. Ines  Chief Complaint  Patient presents with   Follow-up    Pt in 5 with wife    Pt here for memory f/u Pt states same since last visit      HISTORY OF PRESENT ILLNESS: Today 04/12/24   Marcus Bruce is a 83 y.o. male who has been followed in this office for memory disturbance. Returns today for follow-up.  Overall the patient feels that he has done well.  His wife feels that his memory has remained stable.  He lives at home with his spouse.  Able to complete all ADLs independently.  Still completes yard work.  Operates a motor vehicle without difficulty.  Manages his medications appointments and finances.  He states that he used to be active and play tennis but since COVID he has not played as consistently.  He returns today for an evaluation.   HISTORY October 13, 2023: Patient and his wife are here for follow-up.  His formal neurocognitive testing was rather good, very slightly mild cognitive impairment, no suggestion of degenerative disease such as Alzheimer's.  However at the time the CT PET scan results had not come out.  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   But given his very good formal memory testing, I did explain to patient and his wife that he does not have Alzheimer's disease at this time.  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  At this time him and his wife would decline any of the new antibodies and I can completely understand, he is 83 years old, there is a risk of Aria and his formal neurocognitive testing showed very mild cognitive impairment.  At this time we should just monitor.   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   Patient complains of symptoms per HPI as well as the following symptoms: none . Pertinent negatives and positives per HPI. All others negative   06/30/2023: Patient is in the office today to explain in person the findings that can be complicated to explain.  Patient has a family history of dementia unknown kind and he has been experiencing what appears to be more cognitive decline clinically.  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  We also discussed that he has an E4 for Alzheimer's gene which also puts him at increased risk of Alzheimer's disease but does not diagnose the condition.  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  We discussed medications and MCI, he may not be a good candidate for the new monoclonal amyloid antibodies infusions and unfortunately given his pulse of 44 I do feel comfortable putting him on Aricept.  We could try Namenda.  I did ask them if they wanted to proceed with a CT amyloid PET and they do because they  would still like to consider the new monoclonal antibodies, I answered all their questions, wife was slightly teary but ensured them this does not mean he has alzheimers but I did try to reassure them that all of us  are losing her memory and although Alzheimer's is a progressive disorder people's presentations are variable and his could be slowly progressive or not have alzheimers at all at this point.   Patient complains of symptoms per HPI as well as the following symptoms: none . Pertinent negatives and positives per HPI. 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 That is why we proceed with the formal memory testing to sort this all through, some people have more than one cause of cognitive decline. There is small area of leaked blood which can often be seen in amyloidosis, when you have amyloid in the brain and it deposits there and some times the blood vessel leaks.  we have to finish the workup. Thank you, dr ines  REVIEW OF SYSTEMS: Out of a complete 14 system review of symptoms, the patient complains only of the following symptoms, and all other reviewed systems are negative.   Listed in HPI  ALLERGIES: No Known Allergies  HOME MEDICATIONS: Outpatient Medications Prior to Visit  Medication Sig Dispense Refill   carvedilol (COREG) 12.5 MG tablet Take 12.5 mg by mouth 2 (two) times daily.     famotidine (PEPCID) 20 MG tablet Take 2 tablets by mouth daily.     ferrous sulfate 325 (65 FE) MG tablet as needed.     finasteride (PROSCAR) 5 MG tablet 1 tablet     ketoconazole (NIZORAL) 2 % cream as needed.     lactase (LACTAID) 3000 units tablet 1 tablet     losartan-hydrochlorothiazide (HYZAAR) 100-25 MG tablet Take 1 tablet by mouth daily.     NIFEdipine (ADALAT CC) 30 MG 24 hr tablet 2 TABLETS     potassium chloride (KLOR-CON) 10 MEQ tablet Take 1 tablet by mouth daily.     simvastatin (ZOCOR) 40 MG tablet 1 tablet every evening     No facility-administered medications prior to visit.    PAST MEDICAL HISTORY: Past Medical History:  Diagnosis Date   Hypertension    Renal disorder     PAST SURGICAL HISTORY: Past Surgical History:  Procedure Laterality Date   NO PAST SURGERIES      FAMILY HISTORY: Family History  Problem Relation Age of Onset   Dementia Sister    Alzheimer's disease Neg Hx     SOCIAL HISTORY: Social History   Socioeconomic History   Marital  status: Married    Spouse name: Not on file   Number of children: Not on file   Years of education: Not on file   Highest education level: Not on file  Occupational History   Not on file  Tobacco Use   Smoking status: Never   Smokeless tobacco: Never  Vaping Use   Vaping status: Never Used  Substance and Sexual Activity   Alcohol use: Never   Drug use: Never   Sexual activity: Not on file  Other Topics Concern   Not on file  Social History Narrative   Patient has one , patient is married    Retired    Teacher, early years/pre  Strain: Not on file  Food Insecurity: No Food Insecurity (03/22/2022)   Hunger Vital Sign    Worried About Running Out of Food in the Last Year: Never true    Ran Out of Food in the Last Year: Never true  Transportation Needs: No Transportation Needs (03/22/2022)   PRAPARE - Administrator, Civil Service (Medical): No    Lack of Transportation (Non-Medical): No  Physical Activity: Not on file  Stress: Not on file  Social Connections: Not on file  Intimate Partner Violence: Not on file      PHYSICAL EXAM  Vitals:   04/12/24 0919  BP: (!) 154/70  Pulse: (!) 44   There is no height or weight on file to calculate BMI.     04/12/2024    9:22 AM 04/04/2023    8:55 AM  MMSE - Mini Mental State Exam  Orientation to time 5 4  Orientation to Place 4 3  Registration 3 3  Attention/ Calculation 1 1  Recall 2 2  Language- name 2 objects 2 2  Language- repeat 1 1  Language- follow 3 step command 3 3  Language- read & follow direction 1 1  Write a sentence 1 1  Copy design 1 0  Total score 24 21      04/04/2023    9:31 AM  Montreal Cognitive Assessment   Visuospatial/ Executive (0/5) 2  Naming (0/3) 3  Attention: Read list of digits (0/2) 1  Attention: Read list of letters (0/1) 0  Attention: Serial 7 subtraction starting at 100 (0/3) 3  Language: Repeat phrase (0/2) 0  Language : Fluency (0/1) 0   Abstraction (0/2) 2  Delayed Recall (0/5) 2  Orientation (0/6) 5  Total 18     Generalized: Well developed, in no acute distress   Neurological examination  Mentation: Alert oriented to time, place, history taking. Follows all commands speech and language fluent Cranial nerve II-XII: Pupils were equal round reactive to light. Extraocular movements were full, visual field were full on confrontational test. Facial sensation and strength were normal. Uvula tongue midline. Head turning and shoulder shrug  were normal and symmetric. Motor: The motor testing reveals 5 over 5 strength of all 4 extremities. Good symmetric motor tone is noted throughout.  Sensory: Sensory testing is intact to soft touch on all 4 extremities. No evidence of extinction is noted.  Coordination: Cerebellar testing reveals good finger-nose-finger and heel-to-shin bilaterally.  Gait and station: Gait is normal.   Reflexes: Deep tendon reflexes are symmetric and normal bilaterally.   DIAGNOSTIC DATA (LABS, IMAGING, TESTING) - I reviewed patient records, labs, notes, testing and imaging myself where available.  Lab Results  Component Value Date   WBC 3.9 04/04/2023   HGB 12.7 (L) 04/04/2023   HCT 38.4 04/04/2023   MCV 83 04/04/2023   PLT 156 04/04/2023      Component Value Date/Time   NA 142 04/04/2023 1025   K 3.5 04/04/2023 1025   CL 103 04/04/2023 1025   CO2 24 04/04/2023 1025   GLUCOSE 92 04/04/2023 1025   BUN 33 (H) 04/04/2023 1025   CREATININE 1.98 (H) 04/04/2023 1025   CALCIUM 9.4 04/04/2023 1025   PROT 7.5 04/04/2023 1025   ALBUMIN 4.3 04/04/2023 1025   AST 18 04/04/2023 1025   ALT 14 04/04/2023 1025   ALKPHOS 62 04/04/2023 1025   BILITOT 1.0 04/04/2023 1025   GFRNONAA 46 (L) 04/11/2013 1059   GFRAA 53 (L) 04/11/2013  1059   Lab Results  Component Value Date   VITAMINB12 753 04/04/2023   Lab Results  Component Value Date   TSH 2.480 04/04/2023      ASSESSMENT AND PLAN 83 y.o. year  old male  has a past medical history of Hypertension and Renal disorder. here with:  Mild cognitive impairment  - Memory score is stable MMSE 24 out of 30 previously 21 out of 30 - We discussed memory Medication such as Namenda but he prefers to monitor for now. - I did provide him information about Namenda on his AVS.  Should he consider it in the future. - Follow-up in 1 year or sooner if needed   Duwaine Russell, MSN, NP-C 04/12/2024, 9:41 AM [facility] [address] [place], [place] [address] [phone]  The patient's condition requires frequent monitoring and adjustments in the treatment plan, reflecting the ongoing complexity of care.  This provider is the continuing focal point for all needed services for this condition.

## 2024-04-12 ENCOUNTER — Ambulatory Visit: Payer: Medicare Other | Admitting: Adult Health

## 2024-04-12 ENCOUNTER — Encounter: Payer: Self-pay | Admitting: Adult Health

## 2024-04-12 VITALS — BP 154/70 | HR 44

## 2024-04-12 DIAGNOSIS — G3184 Mild cognitive impairment, so stated: Secondary | ICD-10-CM

## 2024-04-12 NOTE — Patient Instructions (Signed)
 Your Plan:  Memory score is stable Ok to continue to monitor but if you wanted to start medication in the future we could consider namenda. Information attached. If your symptoms worsen or you develop new symptoms please let us  know.   Thank you for coming to see us  at [facility]. I hope we have been able to provide you high quality care today.  You may receive a patient satisfaction survey over the next few weeks. We would appreciate your feedback and comments so that we may continue to improve ourselves and the health of our patients.

## 2024-04-15 ENCOUNTER — Telehealth: Payer: Self-pay | Admitting: Adult Health

## 2024-04-15 NOTE — Telephone Encounter (Signed)
 Pt said he was told to call and make dr Ines aware that he did get the carvedilol (COREG) 12.5 MG tablet filled, this is FYI no call back requested

## 2024-04-17 DIAGNOSIS — H401123 Primary open-angle glaucoma, left eye, severe stage: Secondary | ICD-10-CM | POA: Diagnosis not present

## 2024-04-18 ENCOUNTER — Ambulatory Visit (INDEPENDENT_AMBULATORY_CARE_PROVIDER_SITE_OTHER): Admitting: Podiatry

## 2024-04-18 ENCOUNTER — Encounter: Payer: Self-pay | Admitting: Podiatry

## 2024-04-18 DIAGNOSIS — M79674 Pain in right toe(s): Secondary | ICD-10-CM

## 2024-04-18 DIAGNOSIS — M79675 Pain in left toe(s): Secondary | ICD-10-CM | POA: Diagnosis not present

## 2024-04-18 DIAGNOSIS — B351 Tinea unguium: Secondary | ICD-10-CM | POA: Diagnosis not present

## 2024-04-18 DIAGNOSIS — Z86718 Personal history of other venous thrombosis and embolism: Secondary | ICD-10-CM

## 2024-04-18 NOTE — Progress Notes (Signed)
 This patient returns to the office for evaluation and treatment of long thick painful nails .  This patient is unable to trim his own nails since the patient cannot reach his feet.  Patient says the nails are painful walking and wearing his shoes.  He returns for preventive foot care services.  General Appearance  Alert, conversant and in no acute stress.  Vascular  Dorsalis pedis and posterior tibial  pulses are palpable  bilaterally.  Capillary return is within normal limits  bilaterally. Temperature is within normal limits  bilaterally.  Neurologic  Senn-Weinstein monofilament wire test within normal limits  bilaterally. Muscle power within normal limits bilaterally.  Nails Thick disfigured discolored nails with subungual debris  hallux nails  bilaterally. No evidence of bacterial infection or drainage bilaterally.  Orthopedic  No limitations of motion  feet .  No crepitus or effusions noted.  No bony pathology or digital deformities noted.  Skin  normotropic skin with no porokeratosis noted bilaterally.  No signs of infections or ulcers noted.     Onychomycosis  Pain in toes right foot  Pain in toes left foot  Debridement  of nails  1-5  B/L with a nail nipper.  Nails were then filed using a dremel tool with no incidents.    RTC  10  weeks    Helane Gunther DPM

## 2024-05-13 DIAGNOSIS — R351 Nocturia: Secondary | ICD-10-CM | POA: Diagnosis not present

## 2024-05-13 DIAGNOSIS — N401 Enlarged prostate with lower urinary tract symptoms: Secondary | ICD-10-CM | POA: Diagnosis not present

## 2024-05-13 DIAGNOSIS — R972 Elevated prostate specific antigen [PSA]: Secondary | ICD-10-CM | POA: Diagnosis not present

## 2024-05-29 DIAGNOSIS — H47212 Primary optic atrophy, left eye: Secondary | ICD-10-CM | POA: Diagnosis not present

## 2024-05-29 DIAGNOSIS — H2513 Age-related nuclear cataract, bilateral: Secondary | ICD-10-CM | POA: Diagnosis not present

## 2024-05-29 DIAGNOSIS — H401123 Primary open-angle glaucoma, left eye, severe stage: Secondary | ICD-10-CM | POA: Diagnosis not present

## 2024-05-29 DIAGNOSIS — H35033 Hypertensive retinopathy, bilateral: Secondary | ICD-10-CM | POA: Diagnosis not present

## 2024-05-29 DIAGNOSIS — H401111 Primary open-angle glaucoma, right eye, mild stage: Secondary | ICD-10-CM | POA: Diagnosis not present

## 2024-05-30 ENCOUNTER — Other Ambulatory Visit: Payer: Self-pay | Admitting: Ophthalmology

## 2024-05-30 DIAGNOSIS — H47212 Primary optic atrophy, left eye: Secondary | ICD-10-CM

## 2024-06-05 ENCOUNTER — Ambulatory Visit (HOSPITAL_BASED_OUTPATIENT_CLINIC_OR_DEPARTMENT_OTHER)
Admission: RE | Admit: 2024-06-05 | Discharge: 2024-06-05 | Disposition: A | Discharge status: Home / Self Care | Source: Ambulatory Visit | Attending: Ophthalmology | Admitting: Ophthalmology

## 2024-06-05 ENCOUNTER — Ambulatory Visit (HOSPITAL_COMMUNITY)
Admission: RE | Admit: 2024-06-05 | Discharge: 2024-06-05 | Disposition: A | Discharge status: Home / Self Care | Source: Ambulatory Visit | Attending: Ophthalmology | Admitting: Ophthalmology

## 2024-06-05 DIAGNOSIS — H47212 Primary optic atrophy, left eye: Secondary | ICD-10-CM | POA: Diagnosis not present

## 2024-06-05 MED ORDER — GADOBUTROL 1 MMOL/ML IV SOLN
7.0000 mL | Freq: Once | INTRAVENOUS | Status: AC | PRN
  Administered 2024-06-05: 7 mL via INTRAVENOUS

## 2024-06-10 DIAGNOSIS — Z0000 Encounter for general adult medical examination without abnormal findings: Secondary | ICD-10-CM | POA: Diagnosis not present

## 2024-06-10 DIAGNOSIS — I10 Essential (primary) hypertension: Secondary | ICD-10-CM | POA: Diagnosis not present

## 2024-06-10 DIAGNOSIS — K219 Gastro-esophageal reflux disease without esophagitis: Secondary | ICD-10-CM | POA: Diagnosis not present

## 2024-06-10 DIAGNOSIS — E7800 Pure hypercholesterolemia, unspecified: Secondary | ICD-10-CM | POA: Diagnosis not present

## 2024-06-10 DIAGNOSIS — L723 Sebaceous cyst: Secondary | ICD-10-CM | POA: Diagnosis not present

## 2024-06-10 DIAGNOSIS — Z79899 Other long term (current) drug therapy: Secondary | ICD-10-CM | POA: Diagnosis not present

## 2024-06-10 DIAGNOSIS — Z6823 Body mass index (BMI) 23.0-23.9, adult: Secondary | ICD-10-CM | POA: Diagnosis not present

## 2024-06-10 DIAGNOSIS — N184 Chronic kidney disease, stage 4 (severe): Secondary | ICD-10-CM | POA: Diagnosis not present

## 2024-06-17 DIAGNOSIS — Z23 Encounter for immunization: Secondary | ICD-10-CM | POA: Diagnosis not present

## 2024-06-25 DIAGNOSIS — N1832 Chronic kidney disease, stage 3b: Secondary | ICD-10-CM | POA: Diagnosis not present

## 2024-06-27 ENCOUNTER — Ambulatory Visit: Admitting: Podiatry

## 2024-06-27 ENCOUNTER — Encounter: Payer: Self-pay | Admitting: Podiatry

## 2024-06-27 ENCOUNTER — Ambulatory Visit (INDEPENDENT_AMBULATORY_CARE_PROVIDER_SITE_OTHER): Admitting: Podiatry

## 2024-06-27 DIAGNOSIS — M79674 Pain in right toe(s): Secondary | ICD-10-CM | POA: Diagnosis not present

## 2024-06-27 DIAGNOSIS — Z86718 Personal history of other venous thrombosis and embolism: Secondary | ICD-10-CM

## 2024-06-27 DIAGNOSIS — B351 Tinea unguium: Secondary | ICD-10-CM

## 2024-06-27 DIAGNOSIS — L608 Other nail disorders: Secondary | ICD-10-CM

## 2024-06-27 DIAGNOSIS — M79675 Pain in left toe(s): Secondary | ICD-10-CM

## 2024-06-27 NOTE — Progress Notes (Signed)
 This patient returns to the office for evaluation and treatment of long thick painful nails .  This patient is unable to trim his own nails since the patient cannot reach his feet.  Patient says the nails are painful walking and wearing his shoes.  He returns for preventive foot care services.  General Appearance  Alert, conversant and in no acute stress.  Vascular  Dorsalis pedis and posterior tibial  pulses are palpable  bilaterally.  Capillary return is within normal limits  bilaterally. Temperature is within normal limits  bilaterally.  Neurologic  Senn-Weinstein monofilament wire test within normal limits  bilaterally. Muscle power within normal limits bilaterally.  Nails Thick disfigured discolored nails with subungual debris  hallux nails  bilaterally. No evidence of bacterial infection or drainage bilaterally.  Orthopedic  No limitations of motion  feet .  No crepitus or effusions noted.  No bony pathology or digital deformities noted.  Skin  normotropic skin with no porokeratosis noted bilaterally.  No signs of infections or ulcers noted.     Onychomycosis  Pain in toes right foot  Pain in toes left foot  Debridement  of nails  1-5  B/L with a nail nipper.  Nails were then filed using a dremel tool with no incidents.    RTC  10  weeks    Helane Gunther DPM

## 2024-07-01 DIAGNOSIS — N1832 Chronic kidney disease, stage 3b: Secondary | ICD-10-CM | POA: Diagnosis not present

## 2024-07-01 DIAGNOSIS — I129 Hypertensive chronic kidney disease with stage 1 through stage 4 chronic kidney disease, or unspecified chronic kidney disease: Secondary | ICD-10-CM | POA: Diagnosis not present

## 2024-07-08 DIAGNOSIS — I7300 Raynaud's syndrome without gangrene: Secondary | ICD-10-CM | POA: Diagnosis not present

## 2024-07-11 DIAGNOSIS — I10 Essential (primary) hypertension: Secondary | ICD-10-CM | POA: Diagnosis not present

## 2024-07-11 DIAGNOSIS — D649 Anemia, unspecified: Secondary | ICD-10-CM | POA: Diagnosis not present

## 2024-07-11 DIAGNOSIS — N1832 Chronic kidney disease, stage 3b: Secondary | ICD-10-CM | POA: Diagnosis not present

## 2024-07-11 DIAGNOSIS — Z6823 Body mass index (BMI) 23.0-23.9, adult: Secondary | ICD-10-CM | POA: Diagnosis not present

## 2024-07-11 DIAGNOSIS — K219 Gastro-esophageal reflux disease without esophagitis: Secondary | ICD-10-CM | POA: Diagnosis not present

## 2024-07-11 DIAGNOSIS — D696 Thrombocytopenia, unspecified: Secondary | ICD-10-CM | POA: Diagnosis not present

## 2024-07-23 DIAGNOSIS — H401123 Primary open-angle glaucoma, left eye, severe stage: Secondary | ICD-10-CM | POA: Diagnosis not present

## 2024-07-23 DIAGNOSIS — H2513 Age-related nuclear cataract, bilateral: Secondary | ICD-10-CM | POA: Diagnosis not present

## 2024-07-23 DIAGNOSIS — H47212 Primary optic atrophy, left eye: Secondary | ICD-10-CM | POA: Diagnosis not present

## 2024-07-23 DIAGNOSIS — H401111 Primary open-angle glaucoma, right eye, mild stage: Secondary | ICD-10-CM | POA: Diagnosis not present

## 2024-08-07 DIAGNOSIS — N1832 Chronic kidney disease, stage 3b: Secondary | ICD-10-CM | POA: Diagnosis not present

## 2024-08-07 DIAGNOSIS — D696 Thrombocytopenia, unspecified: Secondary | ICD-10-CM | POA: Diagnosis not present

## 2024-08-07 DIAGNOSIS — D649 Anemia, unspecified: Secondary | ICD-10-CM | POA: Diagnosis not present

## 2024-08-07 DIAGNOSIS — K219 Gastro-esophageal reflux disease without esophagitis: Secondary | ICD-10-CM | POA: Diagnosis not present

## 2024-08-07 DIAGNOSIS — I10 Essential (primary) hypertension: Secondary | ICD-10-CM | POA: Diagnosis not present

## 2024-09-04 ENCOUNTER — Ambulatory Visit: Admitting: Podiatry

## 2024-09-04 ENCOUNTER — Encounter: Payer: Self-pay | Admitting: Podiatry

## 2024-09-04 DIAGNOSIS — M79675 Pain in left toe(s): Secondary | ICD-10-CM | POA: Diagnosis not present

## 2024-09-04 DIAGNOSIS — Z86718 Personal history of other venous thrombosis and embolism: Secondary | ICD-10-CM

## 2024-09-04 DIAGNOSIS — B351 Tinea unguium: Secondary | ICD-10-CM

## 2024-09-04 DIAGNOSIS — L608 Other nail disorders: Secondary | ICD-10-CM

## 2024-09-04 DIAGNOSIS — M79674 Pain in right toe(s): Secondary | ICD-10-CM | POA: Diagnosis not present

## 2024-09-04 NOTE — Progress Notes (Signed)
 This patient returns to the office for evaluation and treatment of long thick painful nails .  This patient is unable to trim his own nails since the patient cannot reach his feet.  Patient says the nails are painful walking and wearing his shoes.  He returns for preventive foot care services.  General Appearance  Alert, conversant and in no acute stress.  Vascular  Dorsalis pedis and posterior tibial  pulses are palpable  bilaterally.  Capillary return is within normal limits  bilaterally. Temperature is within normal limits  bilaterally.  Neurologic  Senn-Weinstein monofilament wire test within normal limits  bilaterally. Muscle power within normal limits bilaterally.  Nails Thick disfigured discolored nails with subungual debris  hallux nails  bilaterally. No evidence of bacterial infection or drainage bilaterally.  Orthopedic  No limitations of motion  feet .  No crepitus or effusions noted.  No bony pathology or digital deformities noted.  Skin  normotropic skin with no porokeratosis noted bilaterally.  No signs of infections or ulcers noted.     Onychomycosis  Pain in toes right foot  Pain in toes left foot  Debridement  of nails  1-5  B/L with a nail nipper.  Nails were then filed using a dremel tool with no incidents.    RTC  10  weeks    Helane Gunther DPM

## 2024-11-13 ENCOUNTER — Ambulatory Visit: Admitting: Podiatry

## 2025-04-14 ENCOUNTER — Ambulatory Visit: Admitting: Adult Health
